# Patient Record
Sex: Female | Born: 1985 | Hispanic: Yes | Marital: Single | Smoking: Never smoker
Health system: Southern US, Community
[De-identification: ages and names within clinical notes are randomized; demographics above are authoritative.]

## PROBLEM LIST (undated history)

## (undated) DIAGNOSIS — E119 Type 2 diabetes mellitus without complications: Secondary | ICD-10-CM

## (undated) DIAGNOSIS — F419 Anxiety disorder, unspecified: Secondary | ICD-10-CM

## (undated) DIAGNOSIS — R109 Unspecified abdominal pain: Secondary | ICD-10-CM

## (undated) DIAGNOSIS — R739 Hyperglycemia, unspecified: Secondary | ICD-10-CM

## (undated) HISTORY — PX: CHOLECYSTECTOMY: SHX55

## (undated) HISTORY — PX: TONSILLECTOMY: SUR1361

## (undated) HISTORY — PX: BREAST SURGERY: SHX581

---

## 2016-10-17 ENCOUNTER — Other Ambulatory Visit: Payer: Self-pay | Admitting: Family Medicine

## 2017-03-09 ENCOUNTER — Other Ambulatory Visit: Payer: Self-pay | Admitting: Family Medicine

## 2017-03-09 DIAGNOSIS — R102 Pelvic and perineal pain: Secondary | ICD-10-CM

## 2017-03-19 ENCOUNTER — Ambulatory Visit
Admission: RE | Admit: 2017-03-19 | Discharge: 2017-03-19 | Disposition: A | Discharge status: Home / Self Care | Payer: BLUE CROSS/BLUE SHIELD | Source: Ambulatory Visit | Attending: Family Medicine | Admitting: Family Medicine

## 2017-03-19 DIAGNOSIS — R102 Pelvic and perineal pain: Secondary | ICD-10-CM

## 2017-09-03 ENCOUNTER — Emergency Department
Admission: EM | Admit: 2017-09-03 | Discharge: 2017-09-03 | Disposition: A | Discharge status: Home / Self Care | Payer: BLUE CROSS/BLUE SHIELD | Attending: Emergency Medicine | Admitting: Emergency Medicine

## 2017-09-03 ENCOUNTER — Encounter: Payer: Self-pay | Admitting: Emergency Medicine

## 2017-09-03 ENCOUNTER — Other Ambulatory Visit: Payer: Self-pay

## 2017-09-03 DIAGNOSIS — N730 Acute parametritis and pelvic cellulitis: Secondary | ICD-10-CM

## 2017-09-03 DIAGNOSIS — R319 Hematuria, unspecified: Secondary | ICD-10-CM | POA: Insufficient documentation

## 2017-09-03 DIAGNOSIS — R1013 Epigastric pain: Secondary | ICD-10-CM | POA: Insufficient documentation

## 2017-09-03 DIAGNOSIS — N390 Urinary tract infection, site not specified: Secondary | ICD-10-CM | POA: Insufficient documentation

## 2017-09-03 LAB — WET PREP, GENITAL
Sperm: NONE SEEN
Trich, Wet Prep: NONE SEEN
YEAST WET PREP: NONE SEEN

## 2017-09-03 LAB — CBC
HEMATOCRIT: 38.9 % (ref 35.0–47.0)
HEMOGLOBIN: 12.9 g/dL (ref 12.0–16.0)
MCH: 27.4 pg (ref 26.0–34.0)
MCHC: 33.3 g/dL (ref 32.0–36.0)
MCV: 82.2 fL (ref 80.0–100.0)
Platelets: 336 10*3/uL (ref 150–440)
RBC: 4.73 MIL/uL (ref 3.80–5.20)
RDW: 14.5 % (ref 11.5–14.5)
WBC: 8.2 10*3/uL (ref 3.6–11.0)

## 2017-09-03 LAB — URINALYSIS, COMPLETE (UACMP) WITH MICROSCOPIC
BACTERIA UA: NONE SEEN
BILIRUBIN URINE: NEGATIVE
GLUCOSE, UA: NEGATIVE mg/dL
KETONES UR: NEGATIVE mg/dL
NITRITE: NEGATIVE
PROTEIN: NEGATIVE mg/dL
Specific Gravity, Urine: 1.023 (ref 1.005–1.030)
pH: 5 (ref 5.0–8.0)

## 2017-09-03 LAB — COMPREHENSIVE METABOLIC PANEL
ALBUMIN: 4.4 g/dL (ref 3.5–5.0)
ALT: 13 U/L — ABNORMAL LOW (ref 14–54)
ANION GAP: 8 (ref 5–15)
AST: 20 U/L (ref 15–41)
Alkaline Phosphatase: 65 U/L (ref 38–126)
BUN: 9 mg/dL (ref 6–20)
CHLORIDE: 106 mmol/L (ref 101–111)
CO2: 21 mmol/L — ABNORMAL LOW (ref 22–32)
Calcium: 8.8 mg/dL — ABNORMAL LOW (ref 8.9–10.3)
Creatinine, Ser: 0.53 mg/dL (ref 0.44–1.00)
GFR calc Af Amer: >60 mL/min (ref >60)
GLUCOSE: 99 mg/dL (ref 65–99)
POTASSIUM: 4.2 mmol/L (ref 3.5–5.1)
Sodium: 135 mmol/L (ref 135–145)
Total Bilirubin: 0.5 mg/dL (ref 0.3–1.2)
Total Protein: 7.7 g/dL (ref 6.5–8.1)

## 2017-09-03 LAB — CHLAMYDIA/NGC RT PCR (ARMC ONLY)
CHLAMYDIA TR: NOT DETECTED
N GONORRHOEAE: NOT DETECTED

## 2017-09-03 LAB — POCT PREGNANCY, URINE: Preg Test, Ur: NEGATIVE

## 2017-09-03 LAB — LIPASE, BLOOD: LIPASE: 33 U/L (ref 11–51)

## 2017-09-03 MED ORDER — DOXYCYCLINE HYCLATE 100 MG PO CAPS: 100.0000 mg | ORAL_CAPSULE | Freq: Two times a day (BID) | ORAL | 0 refills | Status: DC

## 2017-09-03 MED ORDER — CEFTRIAXONE SODIUM 1 G IJ SOLR
500.0000 mg | Freq: Once | INTRAMUSCULAR | Status: AC
  Administered 2017-09-03: 500 mg via INTRAMUSCULAR
  Filled 2017-09-03: qty 10

## 2017-09-03 MED ORDER — METRONIDAZOLE 500 MG PO TABS: 500.0000 mg | ORAL_TABLET | Freq: Three times a day (TID) | ORAL | 0 refills | Status: AC

## 2017-09-03 NOTE — ED Provider Notes (Signed)
[facility] Emergency Department Provider Note   ____________________________________________   First MD Initiated Contact with Patient 09/03/17 [phone]     (approximate)  I have reviewed the triage vital signs and the nursing notes.   HISTORY  Chief Complaint Abdominal Pain    HPI Carmen RiserStephanie Francis is a 32 y.o. female Who complains  abdominal pain and dysuria urgency frequency for about 3 days. The abdominal pain is diffuse across lower abdomen she has a little bit of epigastric pain as well. She had a fever at home yesterday. Pain is deep and achy moderately severe and been going on for couple days and getting worse.   History reviewed. No pertinent past medical history.  There are no active problems to display for this patient.   History reviewed. No pertinent surgical history.  Prior to Admission medications   Medication Sig Start Date End Date Taking? Authorizing Provider  doxycycline (VIBRAMYCIN) 100 MG capsule Take 1 capsule (100 mg total) by mouth 2 (two) times daily. 09/03/17   Arnaldo NatalMalinda, Arta Stump F, MD  metroNIDAZOLE (FLAGYL) 500 MG tablet Take 1 tablet (500 mg total) by mouth 3 (three) times daily for 7 days. 09/03/17 09/10/17  Arnaldo NatalMalinda, Feras Gardella F, MD    Allergies Patient has no known allergies.  No family history on file.  Social History Social History   Tobacco Use  . Smoking status: Never Smoker  Substance Use Topics  . Alcohol use: Not on file  . Drug use: Not on file    Review of Systems  Constitutional:  fever/chills Eyes: No visual changes. ENT: No sore throat. Cardiovascular: Denies chest pain. Respiratory: Denies shortness of breath. Gastrointestinal:  abdominal pain.  No nausea, no vomiting.  No diarrhea.  No constipation. Genitourinary:  dysuria. Musculoskeletal: Negative for back pain. Skin: Negative for rash.   ____________________________________________   PHYSICAL EXAM:  VITAL SIGNS: ED Triage Vitals  Enc Vitals  Group     BP 09/03/17 0729 117/78     Pulse Rate 09/03/17 0729 97     Resp 09/03/17 0729 20     Temp 09/03/17 0729 98 F (36.7 C)     Temp Source 09/03/17 0729 Oral     SpO2 09/03/17 0729 99 %     Weight 09/03/17 0730 190 lb (86.2 kg)     Height 09/03/17 0730 5\' 2"  (1.575 m)     Head Circumference --      Peak Flow --      Pain Score 09/03/17 0729 8     Pain Loc --      Pain Edu? --      Excl. in GC? --    Constitutional: Alert and oriented. Well appearing and in no acute distress. Eyes: Conjunctivae are normal. PER. EOMI. Head: Atraumatic. Nose: No congestion/rhinnorhea. Mouth/Throat: Mucous membranes are moist.  Oropharynx non-erythematous. Neck: No stridor.  Cardiovascular: Normal rate, regular rhythm. Grossly normal heart sounds.  Good peripheral circulation. Respiratory: Normal respiratory effort.  No retractions. Lungs CTAB. Gastrointestinal: Soft and nontender. No distention. No abdominal bruits. No CVA tenderness. Genitourinary:normal perineum scant whitish discharge in the vaginashe has cervical motion tenderness and bilateral adnexal tenderness Musculoskeletal: No lower extremity tenderness nor edema.  No joint effusions. Neurologic:  Normal speech and language. No gross focal neurologic deficits are appreciated. No gait instability. Skin:  Skin is warm, dry and intact. No rash noted. Psychiatric: Mood and affect are normal. Speech and behavior are normal.  ____________________________________________   LABS (all labs ordered are  listed, but only abnormal results are displayed)  Labs Reviewed  WET PREP, GENITAL - Abnormal; Notable for the following components:      Result Value   Clue Cells Wet Prep HPF POC PRESENT (*)    WBC, Wet Prep HPF POC RARE (*)    All other components within normal limits  COMPREHENSIVE METABOLIC PANEL - Abnormal; Notable for the following components:   CO2 21 (*)    Calcium 8.8 (*)    ALT 13 (*)    All other components within normal  limits  URINALYSIS, COMPLETE (UACMP) WITH MICROSCOPIC - Abnormal; Notable for the following components:   Color, Urine YELLOW (*)    APPearance CLOUDY (*)    Hgb urine dipstick SMALL (*)    Leukocytes, UA SMALL (*)    All other components within normal limits  CHLAMYDIA/NGC RT PCR ([facility] ONLY)  LIPASE, BLOOD  CBC  POC URINE PREG, ED  POCT PREGNANCY, URINE   ____________________________________________  EKG   ____________________________________________  RADIOLOGY  ED MD interpretation:    Official radiology report(s): No results found.  ____________________________________________   PROCEDURES  Procedure(s) performed:   Procedures  Critical Care performed:  ____________________________________________   INITIAL IMPRESSION / ASSESSMENT AND PLAN / ED COURSE patient with no fever now she does have cervical motion tenderness. We'll treat her with doxycycline and Rocephin and Flagyl. Hopefully this will take care of the problem. She also has what looks like a UTI with some hematuria. The Rocephin and other antibiotics should take care of this. I will have her return if she is worse.          ____________________________________________   FINAL CLINICAL IMPRESSION(S) / ED DIAGNOSES  Final diagnoses:  Urinary tract infection with hematuria, site unspecified  PID (acute pelvic inflammatory disease)     ED Discharge Orders        Ordered    doxycycline (VIBRAMYCIN) 100 MG capsule  2 times daily     09/03/17 1122    metroNIDAZOLE (FLAGYL) 500 MG tablet  3 times daily     09/03/17 1122       Note:  This document was prepared using Dragon voice recognition software and may include unintentional dictation errors.    Arnaldo Natal, MD 09/03/17 1125

## 2017-09-03 NOTE — ED Triage Notes (Addendum)
Lower mid abdominal pain and pain with urination x 3 days. During triage initiation speaking English and states does not need interpreter at this time. Requests interpreter during provider exam.

## 2017-09-03 NOTE — ED Notes (Signed)
First Nurse Note:  Patient to Rm 26.  Stephaine RN aware of placement.

## 2017-09-03 NOTE — ED Notes (Signed)
Pt c/o lower abd pain with N/V and painful urination with clear discharge since Friday.

## 2017-09-03 NOTE — Discharge Instructions (Signed)
Take the Flagyl antibiotic one pill 3 times a day for a week and the doxycycline antibiotic one pill 2 times a day until it is finished. Be careful the doxycycline can make you get sunburns more easily. Make sure you drink a full glass of water each time you take the doxycycline to make sure it doesn't get stuck in your esophagus as it can cause burns if that happens. Return for worse pain, return of high fever, vomiting or if you're not getting any better in 3 or 4 days or see your doctor.

## 2017-09-03 NOTE — ED Notes (Signed)
First Nurse Note:  Patient states she has been ill X 3 days.  Emesis and abdominal pain.  Alert and oriented.

## 2017-09-03 NOTE — ED Notes (Signed)
Pt aware that lab needed another urina specimen. Pt unable to urinate at this time d/t just urinating not very long ago. Pt given specimen cup for when is able to void.

## 2017-09-03 NOTE — ED Notes (Signed)
First Nurse Note:  Lance BoschShea from lab called approx. 10 min ago to request another urine.  Daun PeacockMelanie Tech made aware.

## 2017-09-06 ENCOUNTER — Other Ambulatory Visit: Payer: Self-pay

## 2017-09-06 ENCOUNTER — Emergency Department
Admission: EM | Admit: 2017-09-06 | Discharge: 2017-09-06 | Disposition: A | Discharge status: Home / Self Care | Payer: Self-pay | Attending: Emergency Medicine | Admitting: Emergency Medicine

## 2017-09-06 ENCOUNTER — Emergency Department: Payer: Self-pay

## 2017-09-06 ENCOUNTER — Encounter: Payer: Self-pay | Admitting: Emergency Medicine

## 2017-09-06 DIAGNOSIS — N390 Urinary tract infection, site not specified: Secondary | ICD-10-CM | POA: Insufficient documentation

## 2017-09-06 DIAGNOSIS — B9689 Other specified bacterial agents as the cause of diseases classified elsewhere: Secondary | ICD-10-CM | POA: Insufficient documentation

## 2017-09-06 DIAGNOSIS — N760 Acute vaginitis: Secondary | ICD-10-CM | POA: Insufficient documentation

## 2017-09-06 DIAGNOSIS — R109 Unspecified abdominal pain: Secondary | ICD-10-CM | POA: Insufficient documentation

## 2017-09-06 LAB — URINALYSIS, COMPLETE (UACMP) WITH MICROSCOPIC
Bilirubin Urine: NEGATIVE
Glucose, UA: NEGATIVE mg/dL
Hgb urine dipstick: NEGATIVE
Ketones, ur: 5 mg/dL — AB
Nitrite: NEGATIVE
PH: 5 (ref 5.0–8.0)
Protein, ur: 30 mg/dL — AB
SPECIFIC GRAVITY, URINE: 1.033 — AB (ref 1.005–1.030)

## 2017-09-06 LAB — POCT PREGNANCY, URINE: PREG TEST UR: NEGATIVE

## 2017-09-06 MED ORDER — OXYCODONE-ACETAMINOPHEN 5-325 MG PO TABS
1.0000 | ORAL_TABLET | Freq: Once | ORAL | Status: AC
  Administered 2017-09-06: 1 via ORAL
  Filled 2017-09-06: qty 1

## 2017-09-06 MED ORDER — OXYCODONE-ACETAMINOPHEN 5-325 MG PO TABS: 1.0000 | ORAL_TABLET | Freq: Four times a day (QID) | ORAL | 0 refills | Status: DC | PRN

## 2017-09-06 NOTE — ED Triage Notes (Signed)
States she was seen on Monday for UTI  States pain is to bilateral flank area  Worse on right

## 2017-09-06 NOTE — Discharge Instructions (Addendum)
Follow-up with your primary care provider if any continued problems.  Continue taking antibiotics as directed.  Percocet 1 every 6 hours as needed for severe pain.  Increase fluids.  You may also take ibuprofen if needed for back pain.

## 2017-09-06 NOTE — ED Provider Notes (Signed)
[facility] Emergency Department Provider Note  ____________________________________________   First MD Initiated Contact with Patient 09/06/17 1249     (approximate)  I have reviewed the triage vital signs and the nursing notes.   HISTORY  Chief Complaint Flank Pain   HPI Carmen RiserStephanie Sherlin is a 32 y.o. female complaint of right flank pain.  Patient states that she was seen 3 days ago in the ED at which time she was diagnosed with what she thought was a UTI.  In looking over the patient's chart she was also treated for PID with a shot of Rocephin and given prescriptions for doxycycline and Flagyl.  Patient states that she is currently taking both antibiotics.  She denies any nausea or vomiting.  There is been no fever or chills.  Patient also this morning developed diarrhea.  She denies any worsening of dysuria or frequency.  She is unaware of any past history of kidney stones or family history.   History reviewed. No pertinent past medical history.  There are no active problems to display for this patient.   History reviewed. No pertinent surgical history.  Prior to Admission medications   Medication Sig Start Date End Date Taking? Authorizing Provider  doxycycline (VIBRAMYCIN) 100 MG capsule Take 1 capsule (100 mg total) by mouth 2 (two) times daily. 09/03/17   Arnaldo NatalMalinda, Paul F, MD  metroNIDAZOLE (FLAGYL) 500 MG tablet Take 1 tablet (500 mg total) by mouth 3 (three) times daily for 7 days. 09/03/17 09/10/17  Arnaldo NatalMalinda, Paul F, MD  oxyCODONE-acetaminophen (PERCOCET) 5-325 MG tablet Take 1 tablet by mouth every 6 (six) hours as needed for severe pain. 09/06/17   Tommi RumpsSummers, Tierney Behl L, PA-C    Allergies Patient has no known allergies.  No family history on file.  Social History Social History   Tobacco Use  . Smoking status: Never Smoker  . Smokeless tobacco: Never Used  Substance Use Topics  . Alcohol use: Not on file  . Drug use: Not on file    Review of  Systems Constitutional: No fever/chills Cardiovascular: Denies chest pain. Respiratory: Denies shortness of breath. Gastrointestinal: No abdominal pain.  No nausea, no vomiting.  Positive diarrhea.  No constipation. Genitourinary: Negative for dysuria.  Positive right flank pain. Musculoskeletal: Negative for back pain. Skin: Negative for rash. Neurological: Negative for headaches, focal weakness or numbness. ____________________________________________   PHYSICAL EXAM:  VITAL SIGNS: ED Triage Vitals [09/06/17 1249]  Enc Vitals Group     BP      Pulse      Resp      Temp      Temp src      SpO2      Weight 190 lb (86.2 kg)     Height 5\' 2"  (1.575 m)     Head Circumference      Peak Flow      Pain Score 5     Pain Loc      Pain Edu?      Excl. in GC?    Constitutional: Alert and oriented. Well appearing and in no acute distress. Eyes: Conjunctivae are normal.  Head: Atraumatic. Neck: No stridor.   Cardiovascular: Normal rate, regular rhythm. Grossly normal heart sounds.  Good peripheral circulation. Respiratory: Normal respiratory effort.  No retractions. Lungs CTAB. Gastrointestinal: Soft and nontender. No distention. .  Right CVA tenderness.  Bowel sounds normoactive x4 quadrants. Musculoskeletal: Moves upper and lower extremities without any difficulty.  Normal gait was noted. Neurologic:  Normal speech and language. No gross focal neurologic deficits are appreciated. Skin:  Skin is warm, dry and intact.  Psychiatric: Mood and affect are normal. Speech and behavior are normal.  ____________________________________________   LABS (all labs ordered are listed, but only abnormal results are displayed)  Labs Reviewed  URINALYSIS, COMPLETE (UACMP) WITH MICROSCOPIC - Abnormal; Notable for the following components:      Result Value   Color, Urine AMBER (*)    APPearance HAZY (*)    Specific Gravity, Urine 1.033 (*)    Ketones, ur 5 (*)    Protein, ur 30 (*)     Leukocytes, UA SMALL (*)    Bacteria, UA RARE (*)    All other components within normal limits  URINE CULTURE  POCT PREGNANCY, URINE  POC URINE PREG, ED    RADIOLOGY   Official radiology report(s): Ct Renal Stone Study  Result Date: 09/06/2017 CLINICAL DATA:  BILATERAL flank pain worse on RIGHT, stone disease suspected EXAM: CT ABDOMEN AND PELVIS WITHOUT CONTRAST TECHNIQUE: Multidetector CT imaging of the abdomen and pelvis was performed following the standard protocol without IV contrast. Sagittal and coronal MPR images reconstructed from axial data set. Oral contrast was not administered. COMPARISON:  None FINDINGS: Lower chest: Lung bases clear Hepatobiliary: Post cholecystectomy.  Liver unremarkable. Pancreas: Normal appearance Spleen: Normal appearance Adrenals/Urinary Tract: Adrenal glands normal appearance. Kidneys, ureters, and bladder normal appearance. No urinary tract calcification or dilatation. Stomach/Bowel: Appendix not visualized but no pericecal inflammatory process is seen. Stomach and bowel loops normal appearance Vascular/Lymphatic: Vascular structures unremarkable. Normal size mesenteric lymph nodes. Retroaortic LEFT renal vein. No abdominal or pelvic adenopathy. Reproductive: Dominant follicle LEFT ovary 2.5 x 2.5 cm. Otherwise unremarkable uterus and adnexa Other: No free air or free fluid. No hernia or acute inflammatory process. Musculoskeletal: Bone island LEFT sacrum. No acute osseous findings. IMPRESSION: No acute intra-abdominal or intrapelvic abnormalities. Electronically Signed   By: Ulyses Southward M.D.   On: 09/06/2017 14:27    ____________________________________________   PROCEDURES  Procedure(s) performed: None  Procedures  Critical Care performed: No  ____________________________________________   INITIAL IMPRESSION / ASSESSMENT AND PLAN / ED COURSE  As part of my medical decision making, I reviewed the following data within the electronic medical  record:  Notes from prior ED visits and [place] Controlled Substance Database  Using the interpreter is explained to her that she did have a urinary tract infection along with bacterial vaginosis.  She also admits that she did receive a injection which was Rocephin 3 days ago.  Patient denies any vaginal discharge at this time.  She denies any pelvic pain.  CT renal study is negative for stone.  At this time patient is comfortable after having taken Percocet while in the ED.  Patient was given a prescription to continue every 6 hours as needed for pain.  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  She will finish the antibiotics as prescribed on Monday and follow-up with her primary care provider if any continued problems or return to the emergency department if any severe worsening of her symptoms.  ____________________________________________   FINAL CLINICAL IMPRESSION(S) / ED DIAGNOSES  Final diagnoses:  Right flank pain  Acute urinary tract infection  BV (bacterial vaginosis)     ED Discharge Orders        Ordered    oxyCODONE-acetaminophen (PERCOCET) 5-325 MG tablet  Every 6 hours PRN  09/06/17 1526       Note:  This document was prepared using Dragon voice recognition software and may include unintentional dictation errors.    Tommi Rumps, PA-C 09/06/17 1644    Emily Filbert, MD 09/15/17 [phone]

## 2017-09-08 LAB — URINE CULTURE
Culture: <10000 — AB
SPECIAL REQUESTS: NORMAL

## 2018-06-03 ENCOUNTER — Emergency Department
Admission: EM | Admit: 2018-06-03 | Discharge: 2018-06-03 | Disposition: A | Discharge status: Home / Self Care | Payer: Self-pay | Attending: Emergency Medicine | Admitting: Emergency Medicine

## 2018-06-03 ENCOUNTER — Other Ambulatory Visit: Payer: Self-pay

## 2018-06-03 ENCOUNTER — Emergency Department: Payer: Self-pay

## 2018-06-03 ENCOUNTER — Encounter: Payer: Self-pay | Admitting: Emergency Medicine

## 2018-06-03 DIAGNOSIS — R1031 Right lower quadrant pain: Secondary | ICD-10-CM | POA: Insufficient documentation

## 2018-06-03 DIAGNOSIS — R109 Unspecified abdominal pain: Secondary | ICD-10-CM

## 2018-06-03 HISTORY — DX: Hyperglycemia, unspecified: R73.9

## 2018-06-03 LAB — CBC WITH DIFFERENTIAL/PLATELET
ABS IMMATURE GRANULOCYTES: 0.03 10*3/uL (ref 0.00–0.07)
Basophils Absolute: 0.1 10*3/uL (ref 0.0–0.1)
Basophils Relative: 1 %
EOS PCT: 2 %
Eosinophils Absolute: 0.2 10*3/uL (ref 0.0–0.5)
HCT: 36.8 % (ref 36.0–46.0)
HEMOGLOBIN: 11.2 g/dL — AB (ref 12.0–15.0)
Immature Granulocytes: 0 %
Lymphocytes Relative: 17 %
Lymphs Abs: 1.7 10*3/uL (ref 0.7–4.0)
MCH: 26.2 pg (ref 26.0–34.0)
MCHC: 30.4 g/dL (ref 30.0–36.0)
MCV: 86 fL (ref 80.0–100.0)
MONO ABS: 0.6 10*3/uL (ref 0.1–1.0)
MONOS PCT: 6 %
NEUTROS ABS: 7.4 10*3/uL (ref 1.7–7.7)
Neutrophils Relative %: 74 %
Platelets: 358 10*3/uL (ref 150–400)
RBC: 4.28 MIL/uL (ref 3.87–5.11)
RDW: 14.1 % (ref 11.5–15.5)
WBC: 10 10*3/uL (ref 4.0–10.5)
nRBC: 0 % (ref 0.0–0.2)

## 2018-06-03 LAB — COMPREHENSIVE METABOLIC PANEL
ALK PHOS: 58 U/L (ref 38–126)
ALT: 12 U/L (ref 0–44)
AST: 24 U/L (ref 15–41)
Albumin: 4.3 g/dL (ref 3.5–5.0)
Anion gap: 9 (ref 5–15)
BUN: 9 mg/dL (ref 6–20)
CHLORIDE: 104 mmol/L (ref 98–111)
CO2: 24 mmol/L (ref 22–32)
CREATININE: 0.54 mg/dL (ref 0.44–1.00)
Calcium: 8.8 mg/dL — ABNORMAL LOW (ref 8.9–10.3)
Glucose, Bld: 79 mg/dL (ref 70–99)
POTASSIUM: 3.9 mmol/L (ref 3.5–5.1)
Sodium: 137 mmol/L (ref 135–145)
Total Bilirubin: 0.5 mg/dL (ref 0.3–1.2)
Total Protein: 7.7 g/dL (ref 6.5–8.1)

## 2018-06-03 LAB — URINALYSIS, COMPLETE (UACMP) WITH MICROSCOPIC
Bacteria, UA: NONE SEEN
Bilirubin Urine: NEGATIVE
GLUCOSE, UA: NEGATIVE mg/dL
Hgb urine dipstick: NEGATIVE
KETONES UR: NEGATIVE mg/dL
Leukocytes,Ua: NEGATIVE
Nitrite: NEGATIVE
Protein, ur: NEGATIVE mg/dL
SPECIFIC GRAVITY, URINE: 1.018 (ref 1.005–1.030)
pH: 6 (ref 5.0–8.0)

## 2018-06-03 LAB — POCT PREGNANCY, URINE: Preg Test, Ur: NEGATIVE

## 2018-06-03 LAB — LIPASE, BLOOD: LIPASE: 33 U/L (ref 11–51)

## 2018-06-03 MED ORDER — NAPROXEN 500 MG PO TABS: 500.0000 mg | ORAL_TABLET | Freq: Two times a day (BID) | ORAL | 2 refills | Status: DC

## 2018-06-03 NOTE — ED Triage Notes (Signed)
Right flank pain and now with fever started one week ago.  Vomiting as well.  Swelling to feet and pain.

## 2018-06-03 NOTE — ED Notes (Signed)
Went to ct scan few minutes ago

## 2018-06-03 NOTE — ED Provider Notes (Signed)
[facility] Emergency Department Provider Note   ____________________________________________    I have reviewed the triage vital signs and the nursing notes.   HISTORY  Chief Complaint Flank Pain  Spanish interpreter used   HPI Carmen Francis is a 33 y.o. female who presents with 2 days of right flank pain which is aching in nature and mild to moderate.  Seems to be worse with moving or twisting her torso.  Denies dysuria, no hematuria.  No history of kidney stones.  Is not take anything for this.  No fevers chills nausea or vomiting.  History of cholecystectomy and C-section  Past Medical History:  Diagnosis Date  . Hyperglycemia     There are no active problems to display for this patient.   Past Surgical History:  Procedure Laterality Date  . BREAST SURGERY    . CESAREAN SECTION    . CHOLECYSTECTOMY    . TONSILLECTOMY      Prior to Admission medications   Medication Sig Start Date End Date Taking? Authorizing Provider  doxycycline (VIBRAMYCIN) 100 MG capsule Take 1 capsule (100 mg total) by mouth 2 (two) times daily. 09/03/17   Arnaldo Natal, MD  naproxen (NAPROSYN) 500 MG tablet Take 1 tablet (500 mg total) by mouth 2 (two) times daily with a meal. 06/03/18   Jene Every, MD  oxyCODONE-acetaminophen (PERCOCET) 5-325 MG tablet Take 1 tablet by mouth every 6 (six) hours as needed for severe pain. 09/06/17   Tommi Rumps, PA-C     Allergies Patient has no known allergies.  No family history on file.  Social History Social History   Tobacco Use  . Smoking status: Never Smoker  . Smokeless tobacco: Never Used  Substance Use Topics  . Alcohol use: Not Currently  . Drug use: Not on file    Review of Systems  Constitutional: No fever/chills Eyes: No visual changes.  ENT: No sore throat. Cardiovascular: Denies chest pain. Respiratory: Denies shortness of breath. Gastrointestinal: As above Genitourinary: As  above Musculoskeletal: Negative for back pain. Skin: Negative for rash. Neurological: Negative for headaches or weakness   ____________________________________________   PHYSICAL EXAM:  VITAL SIGNS: ED Triage Vitals  Enc Vitals Group     BP 06/03/18 1112 124/85     Pulse Rate 06/03/18 1112 82     Resp 06/03/18 1112 20     Temp 06/03/18 1112 98.1 F (36.7 C)     Temp Source 06/03/18 1112 Oral     SpO2 06/03/18 1112 100 %     Weight 06/03/18 1115 88.5 kg (195 lb)     Height 06/03/18 1115 1.575 m (5\' 2" )     Head Circumference --      Peak Flow --      Pain Score 06/03/18 1115 8     Pain Loc --      Pain Edu? --      Excl. in GC? --     Constitutional: Alert and oriented.  Eyes: Conjunctivae are normal.   Nose: No congestion/rhinnorhea. Mouth/Throat: Mucous membranes are moist.    Cardiovascular: Normal rate, regular rhythm. Grossly normal heart sounds.  Good peripheral circulation. Respiratory: Normal respiratory effort.  No retractions. Lungs CTAB. Gastrointestinal: Soft and nontender. No distention.  No significant CVA tenderness, reassuring exam Genitourinary: deferred Musculoskeletal:   Warm and well perfused Neurologic:  Normal speech and language. No gross focal neurologic deficits are appreciated.  Skin:  Skin is warm, dry and intact. No  rash noted. Psychiatric: Mood and affect are normal. Speech and behavior are normal.  ____________________________________________   LABS (all labs ordered are listed, but only abnormal results are displayed)  Labs Reviewed  CBC WITH DIFFERENTIAL/PLATELET - Abnormal; Notable for the following components:      Result Value   Hemoglobin 11.2 (*)    All other components within normal limits  COMPREHENSIVE METABOLIC PANEL - Abnormal; Notable for the following components:   Calcium 8.8 (*)    All other components within normal limits  URINALYSIS, COMPLETE (UACMP) WITH MICROSCOPIC - Abnormal; Notable for the following  components:   Color, Urine YELLOW (*)    APPearance CLEAR (*)    All other components within normal limits  LIPASE, BLOOD  POCT PREGNANCY, URINE   ____________________________________________  EKG  None ____________________________________________  RADIOLOGY  CT unremarkable ____________________________________________   PROCEDURES  Procedure(s) performed: No  Procedures   Critical Care performed: No ____________________________________________   INITIAL IMPRESSION / ASSESSMENT AND PLAN / ED COURSE  Pertinent labs & imaging results that were available during my care of the patient were reviewed by me and considered in my medical decision making (see chart for details).  Patient well-appearing and in no acute distress, vital signs quite reassuring.  No CVA tenderness.  Differential includes urinary tract infection, ureterolithiasis, musculoskeletal pain.  Less likely appendicitis.    Lab work is quite reassuring, urinalysis unremarkable.  CT renal stone study is unremarkable.  We will treat with anti-inflammatories have the patient follow-up with her PCP, strict return precautions discussed    ____________________________________________   FINAL CLINICAL IMPRESSION(S) / ED DIAGNOSES  Final diagnoses:  Right flank pain        Note:  This document was prepared using Dragon voice recognition software and may include unintentional dictation errors.   Jene Every, MD 06/03/18 [phone]

## 2018-10-28 ENCOUNTER — Encounter: Payer: Self-pay | Admitting: Emergency Medicine

## 2018-10-28 ENCOUNTER — Emergency Department
Admission: EM | Admit: 2018-10-28 | Discharge: 2018-10-28 | Disposition: A | Discharge status: Home / Self Care | Payer: Medicaid Other | Attending: Emergency Medicine | Admitting: Emergency Medicine

## 2018-10-28 ENCOUNTER — Other Ambulatory Visit: Payer: Self-pay

## 2018-10-28 DIAGNOSIS — N920 Excessive and frequent menstruation with regular cycle: Secondary | ICD-10-CM | POA: Insufficient documentation

## 2018-10-28 DIAGNOSIS — M79606 Pain in leg, unspecified: Secondary | ICD-10-CM | POA: Insufficient documentation

## 2018-10-28 LAB — COMPREHENSIVE METABOLIC PANEL
ALT: 16 U/L (ref 0–44)
AST: 23 U/L (ref 15–41)
Albumin: 4.6 g/dL (ref 3.5–5.0)
Alkaline Phosphatase: 67 U/L (ref 38–126)
Anion gap: 9 (ref 5–15)
BUN: 7 mg/dL (ref 6–20)
CO2: 25 mmol/L (ref 22–32)
Calcium: 9.3 mg/dL (ref 8.9–10.3)
Chloride: 104 mmol/L (ref 98–111)
Creatinine, Ser: 0.54 mg/dL (ref 0.44–1.00)
GFR calc Af Amer: >60 mL/min (ref >60)
GFR calc non Af Amer: >60 mL/min (ref >60)
Glucose, Bld: 96 mg/dL (ref 70–99)
Potassium: 4.2 mmol/L (ref 3.5–5.1)
Sodium: 138 mmol/L (ref 135–145)
Total Bilirubin: 0.5 mg/dL (ref 0.3–1.2)
Total Protein: 7.9 g/dL (ref 6.5–8.1)

## 2018-10-28 LAB — CBC
HCT: 37.7 % (ref 36.0–46.0)
Hemoglobin: 11.8 g/dL — ABNORMAL LOW (ref 12.0–15.0)
MCH: 25.8 pg — ABNORMAL LOW (ref 26.0–34.0)
MCHC: 31.3 g/dL (ref 30.0–36.0)
MCV: 82.3 fL (ref 80.0–100.0)
Platelets: 360 10*3/uL (ref 150–400)
RBC: 4.58 MIL/uL (ref 3.87–5.11)
RDW: 14.3 % (ref 11.5–15.5)
WBC: 8.9 10*3/uL (ref 4.0–10.5)
nRBC: 0 % (ref 0.0–0.2)

## 2018-10-28 LAB — URINALYSIS, COMPLETE (UACMP) WITH MICROSCOPIC
Bacteria, UA: NONE SEEN
Bilirubin Urine: NEGATIVE
Glucose, UA: NEGATIVE mg/dL
Ketones, ur: 5 mg/dL — AB
Leukocytes,Ua: NEGATIVE
Nitrite: NEGATIVE
Protein, ur: NEGATIVE mg/dL
RBC / HPF: >50 RBC/hpf — ABNORMAL HIGH (ref 0–5)
Specific Gravity, Urine: 1.016 (ref 1.005–1.030)
pH: 6 (ref 5.0–8.0)

## 2018-10-28 LAB — LIPASE, BLOOD: Lipase: 27 U/L (ref 11–51)

## 2018-10-28 LAB — POC URINE PREG, ED: Preg Test, Ur: NEGATIVE

## 2018-10-28 MED ORDER — SODIUM CHLORIDE 0.9% FLUSH: 3.0000 mL | Freq: Once | INTRAVENOUS | Status: DC

## 2018-10-28 MED ORDER — IBUPROFEN 800 MG PO TABS: 800.0000 mg | ORAL_TABLET | Freq: Three times a day (TID) | ORAL | 0 refills | Status: DC | PRN

## 2018-10-28 MED ORDER — ONDANSETRON 4 MG PO TBDP: 4.0000 mg | ORAL_TABLET | Freq: Three times a day (TID) | ORAL | 0 refills | Status: DC | PRN

## 2018-10-28 NOTE — ED Triage Notes (Signed)
Pt here with c/o right sided abd pain during her periods, states they are heavy, also bilateral leg pain. NAD.

## 2018-10-28 NOTE — ED Notes (Signed)
See triage note  Presents with right sided abd pain   describes as cramping   States pain started when her period started

## 2018-10-28 NOTE — Discharge Instructions (Addendum)
Your exam and labs are normal at this time. Your symptoms are consistent with premenstrual symptoms like nausea, vomiting, and breast tenderness. You also have heavy regular bleeding and pelvic pain with your periods. You should be evaluated by Gynecology for further management. Take the prescription meds as once daily for pain with at the start of menses and continue until your period is over. Return to the ED as needed.   Su examen y sus laboratorios son normales en este momento. Sus sntomas son consistentes con sntomas premenstruales como nuseas, vmitos y sensibilidad en los senos. Tambin tiene sangrado abundante y dolor plvico con sus perodos. Debe ser evaluado por Ginecologa para su posterior manejo. Tome los medicamentos recetados como una vez al da para el dolor al comienzo de la menstruacin y contine hasta que termine su perodo. Regrese al ED segn sea necesario.

## 2018-10-28 NOTE — ED Provider Notes (Signed)
[facility] Emergency Department Provider Note ____________________________________________  Time seen: 1656  I have reviewed the triage vital signs and the nursing notes.  HISTORY  Chief Complaint  Abdominal Pain and Leg Pain  History limited by Spanish language.  Interpreter present for interview and exam.  HPI Carmen Francis is a 33 y.o. female (G2P2) presents to the ED for evaluation of intermittent right-sided abdominal pain.  Patient describes the pain as achy and cramping, and describes the pain occurs only during her menstrual periods,and has been persistent for the last year.  Patient reports that her menses are quite heavy, and associated with premenstrual symptoms of headache, nausea, vomiting, and breast pain. She has been evaluated by GYN in the past, but is not currently under the care of her current OB provider.  She recalls that she has been told that previous ultrasounds are normal, and offered birth control for her symptoms. She declined hormone therapy for fear of weight-gain. Patient denies any fevers, chills, sweats patient also denies any syncope, fatigue, or unusual cravings.  Past Medical History:  Diagnosis Date  . Hyperglycemia     There are no active problems to display for this patient.   Past Surgical History:  Procedure Laterality Date  . BREAST SURGERY    . CESAREAN SECTION    . CHOLECYSTECTOMY    . TONSILLECTOMY      Prior to Admission medications   Medication Sig Start Date End Date Taking? Authorizing Provider  ibuprofen (ADVIL) 800 MG tablet Take 1 tablet (800 mg total) by mouth every 8 (eight) hours as needed. 10/28/18   Saurabh Hettich, Charlesetta IvoryJenise V Bacon, PA-C  ondansetron (ZOFRAN ODT) 4 MG disintegrating tablet Take 1 tablet (4 mg total) by mouth every 8 (eight) hours as needed. 10/28/18   Katriana Dortch, Charlesetta IvoryJenise V Bacon, PA-C    Allergies Patient has no known allergies.  No family history on file.  Social History Social History    Tobacco Use  . Smoking status: Never Smoker  . Smokeless tobacco: Never Used  Substance Use Topics  . Alcohol use: Not Currently  . Drug use: Not on file    Review of Systems  Constitutional: Negative for fever. Eyes: Negative for visual changes. ENT: Negative for sore throat. Cardiovascular: Negative for chest pain. Respiratory: Negative for shortness of breath. Gastrointestinal: Negative for vomiting and diarrhea. Reports right-sided abdominal pain Genitourinary: Negative for dysuria or vaginal discharge. Reports heavy menses Musculoskeletal: Negative for back pain. Skin: Negative for rash. Neurological: Negative for headaches, focal weakness or numbness. ____________________________________________  PHYSICAL EXAM:  VITAL SIGNS: ED Triage Vitals  Enc Vitals Group     BP 10/28/18 1452 118/66     Pulse Rate 10/28/18 1452 80     Resp 10/28/18 1452 16     Temp 10/28/18 1452 98.9 F (37.2 C)     Temp Source 10/28/18 1452 Oral     SpO2 10/28/18 1452 100 %     Weight 10/28/18 1453 190 lb (86.2 kg)     Height 10/28/18 1453 5\' 2"  (1.575 m)     Head Circumference --      Peak Flow --      Pain Score 10/28/18 1453 7     Pain Loc --      Pain Edu? --      Excl. in GC? --     Constitutional: Alert and oriented. Well appearing and in no distress. Head: Normocephalic and atraumatic. Eyes: Conjunctivae are normal. Normal extraocular movements Cardiovascular:  Normal rate, regular rhythm. Normal distal pulses. Respiratory: Normal respiratory effort. No wheezes/rales/rhonchi. Gastrointestinal: Soft and nontender, except with moderate palpation over the right pelvic region. No distention, rebound, guarding, or rigidity. No CVA tenderness noted.  GYN: defered Musculoskeletal: Nontender with normal range of motion in all extremities.  Neurologic:  Normal gait without ataxia. Normal speech and language. No gross focal neurologic deficits are appreciated. Skin:  Skin is warm, dry  and intact. No rash noted. Psychiatric: Mood and affect are normal. Patient exhibits appropriate insight and judgment. ____________________________________________   LABS (pertinent positives/negatives)  Labs Reviewed  CBC - Abnormal; Notable for the following components:      Result Value   Hemoglobin 11.8 (*)    MCH 25.8 (*)    All other components within normal limits  URINALYSIS, COMPLETE (UACMP) WITH MICROSCOPIC - Abnormal; Notable for the following components:   Color, Urine YELLOW (*)    APPearance HAZY (*)    Hgb urine dipstick LARGE (*)    Ketones, ur 5 (*)    RBC / HPF >50 (*)    All other components within normal limits  LIPASE, BLOOD  COMPREHENSIVE METABOLIC PANEL  POC URINE PREG, ED  ____________________________________________  PROCEDURES  Procedures ____________________________________________  INITIAL IMPRESSION / ASSESSMENT AND PLAN / ED COURSE  [name] was evaluated in Emergency Department on 10/28/2018 for the symptoms described in the history of present illness. She was evaluated in the context of the global COVID-19 pandemic, which necessitated consideration that the patient might be at risk for infection with the SARS-CoV-2 virus that causes COVID-19. Institutional protocols and algorithms that pertain to the evaluation of patients at risk for COVID-19 are in a state of rapid change based on information released by regulatory bodies including the CDC and federal and state organizations. These policies and algorithms were followed during the patient's care in the ED.  Patient with ED evaluation of heavy, regular monthly periods with pelvic pain. Her exam, labs, and recent CT scans were normal. Her symptoms are consistent with menorrhagia and PMS. She may have underlying endometriosis or  adhesions. She is referred to GYN for further evaluation and management. A prescription for IBU 800 mg and Zofran. Return precautions are reviewed.   ____________________________________________  FINAL CLINICAL IMPRESSION(S) / ED DIAGNOSES  Final diagnoses:  Menorrhagia with regular cycle      Carmen Needles, PA-C 10/28/18 1841    Nance Pear, MD 10/28/18 [phone]

## 2018-11-13 ENCOUNTER — Encounter
Admission: RE | Admit: 2018-11-13 | Discharge: 2018-11-13 | Disposition: A | Discharge status: Home / Self Care | Payer: HRSA Program | Source: Ambulatory Visit | Attending: Obstetrics and Gynecology | Admitting: Obstetrics and Gynecology

## 2018-11-13 ENCOUNTER — Other Ambulatory Visit: Payer: Self-pay

## 2018-11-13 DIAGNOSIS — Z20828 Contact with and (suspected) exposure to other viral communicable diseases: Secondary | ICD-10-CM | POA: Diagnosis not present

## 2018-11-13 DIAGNOSIS — Z01812 Encounter for preprocedural laboratory examination: Secondary | ICD-10-CM | POA: Insufficient documentation

## 2018-11-13 DIAGNOSIS — N944 Primary dysmenorrhea: Secondary | ICD-10-CM | POA: Insufficient documentation

## 2018-11-13 HISTORY — DX: Unspecified abdominal pain: R10.9

## 2018-11-13 HISTORY — DX: Anxiety disorder, unspecified: F41.9

## 2018-11-13 LAB — SARS CORONAVIRUS 2 (TAT 6-24 HRS): SARS Coronavirus 2: NEGATIVE

## 2018-11-13 NOTE — Pre-Procedure Instructions (Signed)
Requested orders. 

## 2018-11-13 NOTE — H&P (Signed)
Ms. Mariah MillingMorales is a 33 y.o. female here for ER follow up (rlq pain, menorrhagia)  History of Present Illness: Patient went to the ER on 10/28/2018 with intermittent right-sided abdominal pain. Her menses is heavy and associated with headaches, nausea, vomiting, and breast pain. She has seen GYN in the past and she reports previous normal ultrasounds. She has been offered birth control for her symptoms, she declined hormone therapy for fear of weight gain.  Today: Today, patient reports she has been having severe pelvic pain during her period for about 2 years. Her pain begins at the start of her period and lasts her whole period, that lasts for 6-8 days. Pain has worsened over the years. She cannot get the pain to subside; she has tried Midol at home with no relief. She states her pain is so severe that she goes to sleep crying. At the ER, she was given Ibuprofen 800mg  and Zofran with no relief.  -Menarche was 33yo -Menses: sometimes monthly, sometimes twice monthly  Pertinent Hx: -C/S x2 -Patient thinks she had a BTL at age 33 with her second c/s -Hx of cholecystectomy  Past Medical History:  has a past medical history of Dysmenorrhea and Hyperglycemia.  Past Surgical History:  has a past surgical history that includes Breast surgery; Cesarean section; Cholecystectomy; Tonsillectomy; and Tubal ligation. Family History: family history is not on file. Social History:  reports that she has never smoked. She has never used smokeless tobacco. She reports that she does not use drugs. OB/GYN History:          OB History    Gravida  2   Para  2   Term  2   Preterm      AB      Living  2     SAB      TAB      Ectopic      Molar      Multiple      Live Births           Allergies: has no allergies on file. Medications: Current Outpatient Medications:  .  ibuprofen (MOTRIN) 800 MG tablet, Take 800 mg by mouth every 8 (eight) hours as needed, Disp: , Rfl:  .   citalopram (CELEXA) 20 MG tablet, Take 1 tablet (20 mg total) by mouth once daily, Disp: 30 tablet, Rfl: 11 .  clonazePAM (KLONOPIN) 0.25 MG disintegrating tablet, Take 1 tablet (0.25 mg total) by mouth 2 (two) times daily as needed for up to 30 days, Disp: 60 tablet, Rfl: 0 .  zolpidem (AMBIEN CR) 6.25 MG CR tablet, Take 1 tablet (6.25 mg total) by mouth nightly as needed for Sleep, Disp: 20 tablet, Rfl: 0  Review of Systems: No SOB, no palpitations or chest pain, no new lower extremity edema, no nausea or vomiting or bowel or bladder complaints. See HPI for gyn specific ROS.   Exam:   BP 128/89   Ht 157.5 cm (5\' 2" )   Wt 98.5 kg (217 lb 3.2 oz)   LMP 10/27/2018 (Exact Date)   BMI 39.73 kg/m   Constitutional:  General appearance: Well nourished, well developed female. +Appears anxious  Neuro/psych:  Normal mood and affect. No gross motor deficits. +Appears anxious Neck:  Supple, normal appearance.  Respiratory:  Normal respiratory effort, no use of accessory muscles Skin:  No visible rashes or external lesions  Impression:   The primary encounter diagnosis was Primary dysmenorrhea. A diagnosis of Anxiety was also pertinent to  this visit.  Plan:   1. Primary Dysmenorrhea ddx Endometriosis -Ovarian suppression with hormonal methods were reviewed, both combination (pill, patch, vaginal ring) and progesterone-only (pill, Depo Provera and Nexplanon), intrauterine devices (Mirena, Skyla and Paraguard). The mechanisms, risks, benefits and side effects of all methods were discussed.  All questions have been answered to her satisfaction. -Patient asked if there is a surgical option; she is specifically worried about weight gain on a birth control pill.  -I discussed with the patient that surgery is an option; I would plan for a Diagnostic Laparoscopy to look for possible endometriosis or other gynecological pathologies -Patient would like to move forward with surgery; we will  schedule today. She declines IUD placement under anesthesia.  Patient returns for a preoperative discussion regarding her plans to proceed with surgical management of her primary dysmenorrha by dx lap procedure. She is aware that one of the risks of this surgery for this indication is that I won't be able to dx her pain. She strongly declines any hormonal contraception.   The patient and I discussed the technical aspects of the procedure including the potential for risks and complications. These include but are not limited to the risk of infection requiring post-operative antibiotics or further procedures. We talked about the risk of injury to adjacent organs including bladder, bowel, ureter, blood vessels or nerves. We talked about the need to convert to an open incision. We talked about the possible need for blood transfusion. We talked aboutpostop complications such asthromboembolic or cardiopulmonary complications. All of her questions were answered.  Her preoperative exam was completed and the appropriate consents were signed. She is scheduled to undergo this procedure in the near future.  Specific Peri-operative Considerations:  - Consent: obtained today - Labs: CBC, CMP preoperatively - Studies: EKG, CXR preoperatively - Bowel Preparation: None required - Abx:  None indicated - VTE ppx: SCDs perioperatively  I have encouraged her to apply for [facility], but I don't know logistically how to send her to do that.   2. Anxious -Patient appears anxious at visit today; when asked, she reports that she suffers from anxiety and has been treated in the past with Klonopin, Ambien, and Celexa, which helped her back at her home in [place]. However, she was not able to obtain these medications here with a psychiatrist. She was given another anti anxiety medication that made her symptoms worse. I discussed with her that I may give her medication for her anxiety if she would like and  she stated she would like this option. -I will send in Celexa 20mg , Klonopin 0.25mg , and Ambien 6.25mg . We have talked about the risks of these medications. Her anxiety level is high and impedes her life. These combination of medicines in the past helped a lot.  Diagnoses and all orders for this visit:  Primary dysmenorrhea  Anxiety  Other orders -     citalopram (CELEXA) 20 MG tablet; Take 1 tablet (20 mg total) by mouth once daily -     clonazePAM (KLONOPIN) 0.25 MG disintegrating tablet; Take 1 tablet (0.25 mg total) by mouth 2 (two) times daily as needed for up to 30 days -     zolpidem (AMBIEN CR) 6.25 MG CR tablet; Take 1 tablet (6.25 mg total) by mouth nightly as needed for Sleep

## 2018-11-13 NOTE — Patient Instructions (Signed)
Your procedure is scheduled on: 11/15/2018 Fri Report to Same Day Surgery 2nd floor medical mall [facility] Entrance-take elevator on left to 2nd floor.  Check in with surgery information desk.) To find out your arrival time please call [phone] between 1PM - 3PM on 11/14/2018 Thurs   Remember: Instructions that are not followed completely may result in serious medical risk, up to and including death, or upon the discretion of your surgeon and anesthesiologist your surgery may need to be rescheduled.    _x___ 1. Do not eat food after midnight the night before your procedure. You may drink clear liquids up to 2 hours before you are scheduled to arrive at the hospital for your procedure.  Do not drink clear liquids within 2 hours of your scheduled arrival to the hospital.  Clear liquids include  --Water or Apple juice without pulp  --Clear carbohydrate beverage such as ClearFast or Gatorade  --Black Coffee or Clear Tea (No milk, no creamers, do not add anything to                  the coffee or Tea Type 1 and type 2 diabetics should only drink water.   ____Ensure clear carbohydrate drink on the way to the hospital for bariatric patients  ____Ensure clear carbohydrate drink 3 hours before surgery.   No gum chewing or hard candies.     __x__ 2. No Alcohol for 24 hours before or after surgery.   __x__3. No Smoking or e-cigarettes for 24 prior to surgery.  Do not use any chewable tobacco products for at least 6 hour prior to surgery   ____  4. Bring all medications with you on the day of surgery if instructed.    __x__ 5. Notify your doctor if there is any change in your medical condition     (cold, fever, infections).    x___6. On the morning of surgery brush your teeth with toothpaste and water.  You may rinse your mouth with mouth wash if you wish.  Do not swallow any toothpaste or mouthwash.   Do not wear jewelry, make-up, hairpins, clips or nail polish.  Do not wear lotions,  powders, or perfumes. You may wear deodorant.  Do not shave 48 hours prior to surgery. Men may shave face and neck.  Do not bring valuables to the hospital.    [facility] [address] is not responsible for any belongings or valuables.               Contacts, dentures or bridgework may not be worn into surgery.  Leave your suitcase in the car. After surgery it may be brought to your room.  For patients admitted to the hospital, discharge time is determined by your                       treatment team.  _  Patients discharged the day of surgery will not be allowed to drive home.  You will need someone to drive you home and stay with you the night of your procedure.    Please read over the following fact sheets that you were given:   [facility] Preparing for Surgery and or MRSA Information   _x___ Take anti-hypertensive listed below, cardiac, seizure, asthma,     anti-reflux and psychiatric medicines. These include:  1. None  2.  3.  4.  5.  6.  ____Fleets enema or Magnesium Citrate as directed.   _x___ Use CHG Soap  or sage wipes as directed on instruction sheet   ____ Use inhalers on the day of surgery and bring to hospital day of surgery  ____ Stop Metformin and Janumet 2 days prior to surgery.    ____ Take 1/2 of usual insulin dose the night before surgery and none on the morning     surgery.   _x___ Follow recommendations from Cardiologist, Pulmonologist or PCP regarding          stopping Aspirin, Coumadin, Plavix ,Eliquis, Effient, or Pradaxa, and Pletal.  X____Stop Anti-inflammatories such as Advil, Aleve, Ibuprofen, Motrin, Naproxen, Naprosyn, Goodies powders or aspirin products. OK to take Tylenol and                          Celebrex.   _x___ Stop supplements until after surgery.  But may continue Vitamin D, Vitamin B,       and multivitamin.   ____ Bring C-Pap to the hospital.

## 2018-11-15 ENCOUNTER — Ambulatory Visit
Admission: RE | Admit: 2018-11-15 | Discharge: 2018-11-15 | Disposition: A | Discharge status: Home / Self Care | Payer: Self-pay | Attending: Obstetrics and Gynecology | Admitting: Obstetrics and Gynecology

## 2018-11-15 ENCOUNTER — Ambulatory Visit: Payer: Self-pay | Admitting: Anesthesiology

## 2018-11-15 ENCOUNTER — Encounter: Payer: Self-pay | Admitting: *Deleted

## 2018-11-15 ENCOUNTER — Other Ambulatory Visit: Payer: Self-pay

## 2018-11-15 ENCOUNTER — Encounter
Admission: RE | Disposition: A | Discharge status: Home / Self Care | Payer: Self-pay | Source: Home / Self Care | Attending: Obstetrics and Gynecology

## 2018-11-15 DIAGNOSIS — N736 Female pelvic peritoneal adhesions (postinfective): Secondary | ICD-10-CM | POA: Insufficient documentation

## 2018-11-15 DIAGNOSIS — Z9851 Tubal ligation status: Secondary | ICD-10-CM | POA: Insufficient documentation

## 2018-11-15 DIAGNOSIS — F419 Anxiety disorder, unspecified: Secondary | ICD-10-CM | POA: Insufficient documentation

## 2018-11-15 DIAGNOSIS — N83201 Unspecified ovarian cyst, right side: Secondary | ICD-10-CM | POA: Insufficient documentation

## 2018-11-15 DIAGNOSIS — R1909 Other intra-abdominal and pelvic swelling, mass and lump: Secondary | ICD-10-CM | POA: Insufficient documentation

## 2018-11-15 DIAGNOSIS — Z791 Long term (current) use of non-steroidal anti-inflammatories (NSAID): Secondary | ICD-10-CM | POA: Insufficient documentation

## 2018-11-15 DIAGNOSIS — Z79899 Other long term (current) drug therapy: Secondary | ICD-10-CM | POA: Insufficient documentation

## 2018-11-15 DIAGNOSIS — Z9049 Acquired absence of other specified parts of digestive tract: Secondary | ICD-10-CM | POA: Insufficient documentation

## 2018-11-15 DIAGNOSIS — N946 Dysmenorrhea, unspecified: Secondary | ICD-10-CM | POA: Insufficient documentation

## 2018-11-15 DIAGNOSIS — N83202 Unspecified ovarian cyst, left side: Secondary | ICD-10-CM | POA: Insufficient documentation

## 2018-11-15 HISTORY — PX: LAPAROSCOPY: SHX197

## 2018-11-15 HISTORY — PX: LYSIS OF ADHESION: SHX5961

## 2018-11-15 LAB — TYPE AND SCREEN
ABO/RH(D): O POS
Antibody Screen: NEGATIVE

## 2018-11-15 LAB — POCT PREGNANCY, URINE: Preg Test, Ur: NEGATIVE

## 2018-11-15 LAB — ABO/RH: ABO/RH(D): O POS

## 2018-11-15 SURGERY — LAPAROSCOPY, DIAGNOSTIC
Anesthesia: General

## 2018-11-15 MED ORDER — ACETAMINOPHEN 500 MG PO TABS
1000.0000 mg | ORAL_TABLET | ORAL | Status: AC
  Administered 2018-11-15: 1000 mg via ORAL

## 2018-11-15 MED ORDER — MEPERIDINE HCL 50 MG/ML IJ SOLN: 6.2500 mg | INTRAMUSCULAR | Status: DC | PRN

## 2018-11-15 MED ORDER — KETOROLAC TROMETHAMINE 30 MG/ML IJ SOLN
INTRAMUSCULAR | Status: DC | PRN
  Administered 2018-11-15: 30 mg via INTRAVENOUS

## 2018-11-15 MED ORDER — PROPOFOL 10 MG/ML IV BOLUS
INTRAVENOUS | Status: DC | PRN
  Administered 2018-11-15: 150 mg via INTRAVENOUS

## 2018-11-15 MED ORDER — MIDAZOLAM HCL 2 MG/2ML IJ SOLN
INTRAMUSCULAR | Status: AC
  Filled 2018-11-15: qty 2

## 2018-11-15 MED ORDER — OXYCODONE HCL 5 MG/5ML PO SOLN
ORAL | Status: AC
  Filled 2018-11-15: qty 5

## 2018-11-15 MED ORDER — ONDANSETRON HCL 4 MG/2ML IJ SOLN
INTRAMUSCULAR | Status: DC | PRN
  Administered 2018-11-15: 4 mg via INTRAVENOUS

## 2018-11-15 MED ORDER — MIDAZOLAM HCL 2 MG/2ML IJ SOLN
INTRAMUSCULAR | Status: DC | PRN
  Administered 2018-11-15: 2 mg via INTRAVENOUS

## 2018-11-15 MED ORDER — SUGAMMADEX SODIUM 200 MG/2ML IV SOLN
INTRAVENOUS | Status: DC | PRN
  Administered 2018-11-15: 200 mg via INTRAVENOUS

## 2018-11-15 MED ORDER — ROCURONIUM BROMIDE 100 MG/10ML IV SOLN
INTRAVENOUS | Status: DC | PRN
  Administered 2018-11-15: 40 mg via INTRAVENOUS
  Administered 2018-11-15: 10 mg via INTRAVENOUS

## 2018-11-15 MED ORDER — GABAPENTIN 800 MG PO TABS: 800.0000 mg | ORAL_TABLET | Freq: Every day | ORAL | 0 refills | Status: AC

## 2018-11-15 MED ORDER — OXYCODONE HCL 5 MG PO TABS: 5.0000 mg | ORAL_TABLET | Freq: Once | ORAL | Status: AC | PRN

## 2018-11-15 MED ORDER — FENTANYL CITRATE (PF) 100 MCG/2ML IJ SOLN
INTRAMUSCULAR | Status: AC
  Filled 2018-11-15: qty 2

## 2018-11-15 MED ORDER — OXYCODONE HCL 5 MG PO CAPS: 5.0000 mg | ORAL_CAPSULE | Freq: Four times a day (QID) | ORAL | 0 refills | Status: DC | PRN

## 2018-11-15 MED ORDER — FAMOTIDINE 20 MG PO TABS
20.0000 mg | ORAL_TABLET | Freq: Once | ORAL | Status: AC
  Administered 2018-11-15: 20 mg via ORAL

## 2018-11-15 MED ORDER — FENTANYL CITRATE (PF) 100 MCG/2ML IJ SOLN
INTRAMUSCULAR | Status: DC | PRN
  Administered 2018-11-15 (×2): 50 ug via INTRAVENOUS

## 2018-11-15 MED ORDER — DEXAMETHASONE SODIUM PHOSPHATE 10 MG/ML IJ SOLN
INTRAMUSCULAR | Status: DC | PRN
  Administered 2018-11-15: 10 mg via INTRAVENOUS

## 2018-11-15 MED ORDER — PROMETHAZINE HCL 25 MG/ML IJ SOLN: 6.2500 mg | INTRAMUSCULAR | Status: DC | PRN

## 2018-11-15 MED ORDER — LACTATED RINGERS IV SOLN
INTRAVENOUS | Status: DC
  Administered 2018-11-15: 10:00:00 via INTRAVENOUS

## 2018-11-15 MED ORDER — LACTATED RINGERS IV SOLN: INTRAVENOUS | Status: DC

## 2018-11-15 MED ORDER — PROPOFOL 10 MG/ML IV BOLUS
INTRAVENOUS | Status: AC
  Filled 2018-11-15: qty 20

## 2018-11-15 MED ORDER — BUPIVACAINE HCL 0.5 % IJ SOLN
INTRAMUSCULAR | Status: DC | PRN
  Administered 2018-11-15: 11 mL

## 2018-11-15 MED ORDER — PHENYLEPHRINE HCL (PRESSORS) 10 MG/ML IV SOLN
INTRAVENOUS | Status: DC | PRN
  Administered 2018-11-15: 100 ug via INTRAVENOUS

## 2018-11-15 MED ORDER — ACETAMINOPHEN 500 MG PO TABS
ORAL_TABLET | ORAL | Status: AC
  Administered 2018-11-15: 1000 mg via ORAL
  Filled 2018-11-15: qty 2

## 2018-11-15 MED ORDER — HEMOSTATIC AGENTS (NO CHARGE) OPTIME
TOPICAL | Status: DC | PRN
  Administered 2018-11-15: 1 via TOPICAL

## 2018-11-15 MED ORDER — IBUPROFEN 800 MG PO TABS: 800.0000 mg | ORAL_TABLET | Freq: Three times a day (TID) | ORAL | 1 refills | Status: AC

## 2018-11-15 MED ORDER — GABAPENTIN 300 MG PO CAPS: 300.0000 mg | ORAL_CAPSULE | ORAL | Status: DC

## 2018-11-15 MED ORDER — BUPIVACAINE HCL (PF) 0.5 % IJ SOLN
INTRAMUSCULAR | Status: AC
  Filled 2018-11-15: qty 30

## 2018-11-15 MED ORDER — LIDOCAINE HCL (CARDIAC) PF 100 MG/5ML IV SOSY
PREFILLED_SYRINGE | INTRAVENOUS | Status: DC | PRN
  Administered 2018-11-15: 100 mg via INTRAVENOUS

## 2018-11-15 MED ORDER — FENTANYL CITRATE (PF) 100 MCG/2ML IJ SOLN
25.0000 ug | INTRAMUSCULAR | Status: DC | PRN
  Administered 2018-11-15: 25 ug via INTRAVENOUS
  Administered 2018-11-15 (×2): 50 ug via INTRAVENOUS

## 2018-11-15 MED ORDER — GABAPENTIN 300 MG PO CAPS
ORAL_CAPSULE | ORAL | Status: AC
  Administered 2018-11-15: 300 mg
  Filled 2018-11-15: qty 1

## 2018-11-15 MED ORDER — DOCUSATE SODIUM 100 MG PO CAPS: 100.0000 mg | ORAL_CAPSULE | Freq: Two times a day (BID) | ORAL | 0 refills | Status: AC

## 2018-11-15 MED ORDER — ACETAMINOPHEN 500 MG PO TABS: 1000.0000 mg | ORAL_TABLET | Freq: Four times a day (QID) | ORAL | 0 refills | Status: AC

## 2018-11-15 MED ORDER — FAMOTIDINE 20 MG PO TABS
ORAL_TABLET | ORAL | Status: AC
  Administered 2018-11-15: 09:00:00 20 mg via ORAL
  Filled 2018-11-15: qty 1

## 2018-11-15 MED ORDER — OXYCODONE HCL 5 MG/5ML PO SOLN
5.0000 mg | Freq: Once | ORAL | Status: AC | PRN
  Administered 2018-11-15: 5 mg via ORAL

## 2018-11-15 SURGICAL SUPPLY — 50 items
APPLICATOR ARISTA FLEXITIP XL (MISCELLANEOUS) ×2 IMPLANT
BAG URINE DRAINAGE (UROLOGICAL SUPPLIES) ×4 IMPLANT
BLADE SURG SZ11 CARB STEEL (BLADE) ×4 IMPLANT
CATH FOLEY 2WAY  5CC 16FR (CATHETERS) ×2
CATH URTH 16FR FL 2W BLN LF (CATHETERS) ×2 IMPLANT
CHLORAPREP W/TINT 26 (MISCELLANEOUS) ×4 IMPLANT
CLOSURE WOUND 1/4X4 (GAUZE/BANDAGES/DRESSINGS) ×1
CORD MONOPOLAR M/FML 12FT (MISCELLANEOUS) ×2 IMPLANT
COVER WAND RF STERILE (DRAPES) IMPLANT
DERMABOND ADVANCED (GAUZE/BANDAGES/DRESSINGS) ×2
DERMABOND ADVANCED .7 DNX12 (GAUZE/BANDAGES/DRESSINGS) ×2 IMPLANT
DRAPE GENERAL ENDO 106X123.5 (DRAPES) ×4 IMPLANT
DRAPE LEGGINS SURG 28X43 STRL (DRAPES) ×4 IMPLANT
DRAPE STERI POUCH LG 24X46 STR (DRAPES) IMPLANT
DRAPE UNDER BUTTOCK W/FLU (DRAPES) ×4 IMPLANT
GLOVE BIO SURGEON STRL SZ7 (GLOVE) ×10 IMPLANT
GLOVE INDICATOR 7.5 STRL GRN (GLOVE) ×4 IMPLANT
GOWN STRL REUS W/ TWL LRG LVL3 (GOWN DISPOSABLE) ×4 IMPLANT
GOWN STRL REUS W/TWL LRG LVL3 (GOWN DISPOSABLE) ×4
HEMOSTAT ARISTA ABSORB 3G PWDR (HEMOSTASIS) ×2 IMPLANT
IRRIGATION STRYKERFLOW (MISCELLANEOUS) ×2 IMPLANT
IRRIGATOR STRYKERFLOW (MISCELLANEOUS) ×4
IV NS 1000ML (IV SOLUTION) ×2
IV NS 1000ML BAXH (IV SOLUTION) ×2 IMPLANT
KIT PINK PAD W/HEAD ARE REST (MISCELLANEOUS) ×4
KIT PINK PAD W/HEAD ARM REST (MISCELLANEOUS) ×2 IMPLANT
KIT TURNOVER CYSTO (KITS) ×4 IMPLANT
LABEL OR SOLS (LABEL) ×2 IMPLANT
LIGASURE LAP MARYLAND 5MM 37CM (ELECTROSURGICAL) IMPLANT
LIGASURE VESSEL 5MM BLUNT TIP (ELECTROSURGICAL) ×2 IMPLANT
NDL FILTER BLUNT 18X1 1/2 (NEEDLE) ×2 IMPLANT
NEEDLE FILTER BLUNT 18X 1/2SAF (NEEDLE) ×2
NEEDLE FILTER BLUNT 18X1 1/2 (NEEDLE) ×2 IMPLANT
NS IRRIG 500ML POUR BTL (IV SOLUTION) ×4 IMPLANT
PACK GYN LAPAROSCOPIC (MISCELLANEOUS) ×4 IMPLANT
PAD OB MATERNITY 4.3X12.25 (PERSONAL CARE ITEMS) ×4 IMPLANT
PAD PREP 24X41 OB/GYN DISP (PERSONAL CARE ITEMS) ×4 IMPLANT
POUCH SPECIMEN RETRIEVAL 10MM (ENDOMECHANICALS) IMPLANT
SCISSORS METZENBAUM CVD 33 (INSTRUMENTS) ×2 IMPLANT
SET TUBE SMOKE EVAC HIGH FLOW (TUBING) ×4 IMPLANT
SLEEVE ENDOPATH XCEL 5M (ENDOMECHANICALS) ×6 IMPLANT
STRIP CLOSURE SKIN 1/4X4 (GAUZE/BANDAGES/DRESSINGS) ×3 IMPLANT
SUT MNCRL AB 4-0 PS2 18 (SUTURE) ×4 IMPLANT
SUT VIC AB 2-0 UR6 27 (SUTURE) ×4 IMPLANT
SUT VIC AB 4-0 SH 27 (SUTURE) ×2
SUT VIC AB 4-0 SH 27XANBCTRL (SUTURE) ×2 IMPLANT
SYR 50ML LL SCALE MARK (SYRINGE) IMPLANT
SYR 5ML LL (SYRINGE) ×4 IMPLANT
TROCAR XCEL NON-BLD 5MMX100MML (ENDOMECHANICALS) ×4 IMPLANT
TUBING ART PRESS 48 MALE/FEM (TUBING) IMPLANT

## 2018-11-15 NOTE — Anesthesia Post-op Follow-up Note (Signed)
Anesthesia QCDR form completed.        

## 2018-11-15 NOTE — Discharge Instructions (Addendum)
Laparoscopic Ovarian Surgery Discharge Instructions  For the next three days, take ibuprofen and acetaminophen on a schedule, every 8 hours. You can take them together or you can intersperse them, and take one every four hours. I also gave you gabapentin for nighttime, to help you sleep and also to control pain. Take gabapentin medicines at night for at least the next 3 nights. You also have a narcotic, oxycodone, to take as needed if the above medicines don't help.  Postop constipation is a major cause of pain. Stay well hydrated, walk as you tolerate, and take over the counter senna as well as stool softeners if you need them.   RISKS AND COMPLICATIONS   Infection.  Bleeding.  Injury to surrounding organs.  Anesthetic side effects.   PROCEDURE   You may be given a medicine to help you relax (sedative) before the procedure. You will be given a medicine to make you sleep (general anesthetic) during the procedure.  A tube will be put down your throat to help your breath while under general anesthesia.  Several small cuts (incisions) are made in the lower abdominal area and one incision is made near the belly button.  Your abdominal area will be inflated with a safe gas (carbon dioxide). This helps give the surgeon room to operate, visualize, and helps the surgeon avoid other organs.  A thin, lighted tube (laparoscope) with a camera attached is inserted into your abdomen through the incision near the belly button. Other small instruments may also be inserted through other abdominal incisions.  The ovary is located and are removed.  After the ovary is removed, the gas is released from the abdomen.  The incisions will be closed with stitches (sutures), and Dermabond. A bandage may be placed over the incisions.  AFTER THE PROCEDURE   You will also have some mild abdominal discomfort for 3-7 days. You will be given pain medicine to ease any discomfort.  As long as there are no  problems, you may be allowed to go home. Someone will need to drive you home and be with you for at least 24 hours once home.  You may have some mild discomfort in the throat. This is from the tube placed in your throat while you were sleeping.  You may experience discomfort in the shoulder area from some trapped air between the liver and diaphragm. This sensation is normal and will slowly go away on its own.  HOME CARE INSTRUCTIONS   Take all medicines as directed.  Only take over-the-counter or prescription medicines for pain, discomfort, or fever as directed by your caregiver.  Resume daily activities as directed.  Showers are preferred over baths for 2 weeks.  You may resume sexual activities in 1 week or as you feel you would like to.  Do not drive while taking narcotics.  SEEK MEDICAL CARE IF: .  There is increasing abdominal pain.  You feel lightheaded or faint.  You have the chills.  You have an oral temperature above 102 F (38.9 C).  There is pus-like (purulent) drainage from any of the wounds.  You are unable to pass gas or have a bowel movement.  You feel sick to your stomach (nauseous) or throw up (vomit) and can't control it with your medicines.  MAKE SURE YOU:   Understand these instructions.  Will watch your condition.  Will get help right away if you are not doing well or get worse.  ExitCare Patient Information 2013 [employer]     AMBULATORY SURGERY  DISCHARGE INSTRUCTIONS   1) The drugs that you were given will stay in your system until tomorrow so for the next 24 hours you should not:  A) Drive an automobile B) Make any legal decisions C) Drink any alcoholic beverage   2) You may resume regular meals tomorrow.  Today it is better to start with liquids and gradually work up to solid foods.  You may eat anything you prefer, but it is better to start with liquids, then soup and crackers, and gradually work up to solid  foods.   3) Please notify your doctor immediately if you have any unusual bleeding, trouble breathing, redness and pain at the surgery site, drainage, fever, or pain not relieved by medication.    4) Additional Instructions:        Please contact your physician with any problems or Same Day Surgery at [phone], Monday through Friday 6 am to 4 pm, or [place] [facility] [place] Main number at [phone].   

## 2018-11-15 NOTE — Anesthesia Procedure Notes (Signed)
Procedure Name: Intubation Date/Time: 11/15/2018 10:29 AM Performed by: Justus Memory, CRNA Pre-anesthesia Checklist: Patient identified, Patient being monitored, Timeout performed, Emergency Drugs available and Suction available Patient Re-evaluated:Patient Re-evaluated prior to induction Oxygen Delivery Method: Circle system utilized Preoxygenation: Pre-oxygenation with 100% oxygen Induction Type: IV induction Ventilation: Mask ventilation without difficulty Laryngoscope Size: Mac and 3 Grade View: Grade I Tube type: Oral Tube size: 7.0 mm Number of attempts: 1 Airway Equipment and Method: Stylet Placement Confirmation: ETT inserted through vocal cords under direct vision,  positive ETCO2 and breath sounds checked- equal and bilateral Secured at: 21 cm Tube secured with: Tape Dental Injury: Teeth and Oropharynx as per pre-operative assessment

## 2018-11-15 NOTE — Interval H&P Note (Signed)
History and Physical Interval Note:  11/15/2018 10:08 AM  Carmen Francis  has presented today for surgery, with the diagnosis of primary dysmenorrhea, resistant to conservative managment.  The various methods of treatment have been discussed with the patient and family. After consideration of risks, benefits and other options for treatment, the patient has consented to  Procedure(s): LAPAROSCOPY DIAGNOSTIC (N/A) with peritoneal biopsies and other indicated procedures as a surgical intervention.  The patient's history has been reviewed, patient examined, no change in status, stable for surgery.  I have reviewed the patient's chart and labs.  Questions were answered to the patient's satisfaction.     Benjaman Kindler

## 2018-11-15 NOTE — Anesthesia Postprocedure Evaluation (Signed)
Anesthesia Post Note  Patient: Carmen Francis  Procedure(s) Performed: LAPAROSCOPY operative, left salpingectomy (N/A ) LYSIS OF ADHESION  Patient location during evaluation: PACU Anesthesia Type: General Level of consciousness: awake and alert and oriented Pain management: pain level controlled Vital Signs Assessment: post-procedure vital signs reviewed and stable Respiratory status: spontaneous breathing, nonlabored ventilation and respiratory function stable Cardiovascular status: blood pressure returned to baseline and stable Postop Assessment: no signs of nausea or vomiting Anesthetic complications: no     Last Vitals:  Vitals:   11/15/18 1216 11/15/18 1231  BP: 122/75 103/64  Pulse: 80 67  Resp: (!) 24 20  Temp:    SpO2: 100% 100%    Last Pain:  Vitals:   11/15/18 1231  TempSrc:   PainSc: Asleep                 Bora Bost

## 2018-11-15 NOTE — Transfer of Care (Signed)
Immediate Anesthesia Transfer of Care Note  Patient: Carmen Francis  Procedure(s) Performed: LAPAROSCOPY operative, left salpingectomy (N/A ) LYSIS OF ADHESION  Patient Location: PACU  Anesthesia Type:General  Level of Consciousness: sedated  Airway & Oxygen Therapy: Patient Spontanous Breathing and Patient connected to face mask oxygen  Post-op Assessment: Report given to RN and Post -op Vital signs reviewed and stable  Post vital signs: Reviewed and stable  Last Vitals:  Vitals Value Taken Time  BP 114/66 11/15/18 1201  Temp 37.3 C 11/15/18 1201  Pulse 83 11/15/18 1201  Resp 24 11/15/18 1206  SpO2 100 % 11/15/18 1201  Vitals shown include unvalidated device data.  Last Pain:  Vitals:   11/15/18 0908  TempSrc: Tympanic  PainSc: 5       Patients Stated Pain Goal: 3 (42/59/56 [id number])  Complications: No apparent anesthesia complications

## 2018-11-15 NOTE — Anesthesia Preprocedure Evaluation (Signed)
Anesthesia Evaluation  Patient identified by MRN, date of birth, ID band Patient awake    Reviewed: Allergy & Precautions, NPO status , Patient's Chart, lab work & pertinent test results  History of Anesthesia Complications Negative for: history of anesthetic complications  Airway Mallampati: III  TM Distance: >3 FB Neck ROM: Full    Dental no notable dental hx.    Pulmonary neg pulmonary ROS, neg sleep apnea, neg COPD,    breath sounds clear to auscultation- rhonchi (-) wheezing      Cardiovascular Exercise Tolerance: Good (-) hypertension(-) CAD, (-) Past MI, (-) Cardiac Stents and (-) CABG  Rhythm:Regular Rate:Normal - Systolic murmurs and - Diastolic murmurs    Neuro/Psych neg Seizures Anxiety negative neurological ROS     GI/Hepatic negative GI ROS, Neg liver ROS,   Endo/Other  negative endocrine ROSneg diabetes  Renal/GU negative Renal ROS     Musculoskeletal negative musculoskeletal ROS (+)   Abdominal (+) + obese,   Peds  Hematology negative hematology ROS (+)   Anesthesia Other Findings Past Medical History: No date: Abdominal pain     Comment:  Right lower No date: Anxiety No date: Hyperglycemia   Reproductive/Obstetrics                             Anesthesia Physical Anesthesia Plan  ASA: II  Anesthesia Plan: General   Post-op Pain Management:    Induction: Intravenous  PONV Risk Score and Plan: 2 and Dexamethasone, Ondansetron and Midazolam  Airway Management Planned: Oral ETT  Additional Equipment:   Intra-op Plan:   Post-operative Plan: Extubation in OR  Informed Consent: I have reviewed the patients History and Physical, chart, labs and discussed the procedure including the risks, benefits and alternatives for the proposed anesthesia with the patient or authorized representative who has indicated his/her understanding and acceptance.     Dental advisory  given  Plan Discussed with: CRNA and Anesthesiologist  Anesthesia Plan Comments:         Anesthesia Quick Evaluation

## 2018-11-15 NOTE — Op Note (Addendum)
Carmen RiserStephanie Francis PROCEDURE DATE: 11/15/2018  INDICATIONS: Acute pelvic pain  PREOPERATIVE DIAGNOSIS: Pelvic pain  POSTOPERATIVE DIAGNOSIS: Adnexal mass on left, hx of pelvic pain, adhesions PROCEDURE: Exam under anesthesia, operative laparoscopy, lysis of adhesions, left salpingectomy SURGEON:  Dr. Christeen DouglasBethany Isabell Bonafede ASSISTANT: CST ANESTHESIOLOGIST: Alver FisherPenwarden, Amy, MD Anesthesiologist: Alver FisherPenwarden, Amy, MD CRNA: Almeta MonasFletcher, Patricia, CRNA; Ginger CarneMichelet, Earma, CRNA  INDICATIONS: 33 y.o. F with history of acute-onset pelvic pain desiring surgical evaluation.   Please see preoperative notes for further details.  Risks of surgery were discussed with the patient including but not limited to: bleeding which may require transfusion or reoperation; infection which may require antibiotics; injury to bowel, bladder, ureters or other surrounding organs; need for additional procedures including laparotomy; thromboembolic phenomenon, incisional problems and other postoperative/anesthesia complications. Written informed consent was obtained.    FINDINGS:  Small uterus, normal right ovary and fallopian tube. Left sided fallopian tube cyst with dense adhesions to the left pelvic side wall.Ovarian cyst on right side. No other abdominal/pelvic abnormality.  Normal upper abdomen.  No evidence of peritoneal endometriosis, beyond multiple sites of adhesion between the pelvic organs and the pelvic sidewall.  Her cesarean section adhesions were present but moderately dense.  Her posterior cul-de-sac was free from endometriosis, as were her bilateral uterosacral ligaments and bilateral ovarian fossae.    ANESTHESIA:    General INTRAVENOUS FLUIDS: 800 ml ESTIMATED BLOOD LOSS: 25 ml HEMOPERITONEUM: 0 URINE OUTPUT: 200 ml SPECIMENS: Left tube and cyst COMPLICATIONS: None immediate  PROCEDURE IN DETAIL:  The patient had sequential compression devices applied to her lower extremities while in the preoperative area.  She  was then taken to the operating room where general anesthesia was administered and was found to be adequate.  She was placed in the dorsal lithotomy position, and was prepped and draped in a sterile manner.  A Foley catheter was inserted into her bladder and attached to constant drainage and a uterine manipulator was then advanced into the uterus .  After an adequate timeout was performed, attention was turned to the abdomen where an umbilical incision was made with the scalpel.  The Optiview 5-mm trocar and sleeve were then advanced without difficulty with the laparoscope under direct visualization into the abdomen.  The abdomen was then insufflated with carbon dioxide gas and adequate pneumoperitoneum was obtained.   A detailed survey of the patient's pelvis and abdomen revealed the findings as mentioned above.  A LigaSure device was used to remove right lower pelvic adhesions, prior to placement of the right lower quadrant port. Two additional 5mm trocars were placed in the bilateral lower quadrants under direct visualization.  On the left, the left fallopian tube and tubal cyst was apparent beneath deep adhesions superiorly to the ovary and above the plane of the round ligament.  This whole complex was carefully dissected away from the pelvic sidewall, being careful to avoid the bowel and deeper into the peritoneum.  Hemostasis was assured with electrocautery. The pelvis was irrigated and all fluid and blood removed.  The specimens were removed from the left lower quadrant port.  Left ovarian cyst ruptured for active hemorrhagic fluid, which was controlled with electrocautery and hemostatic agent in the cyst.  The operative site was surveyed, and it was found to be hemostatic.  No intraoperative injury to surrounding organs was noted.   Pictures were taken of the quadrants and pelvis. The abdomen was desufflated and all instruments were then removed from the patient's abdomen. The uterine manipulator was  removed without  complications.  All incisions were closed with 4-0 Vicryl and Dermabond.   The patient tolerated the procedures well.  All instruments, needles, and sponge counts were correct x 2. The patient was taken to the recovery room in stable condition.

## 2018-11-16 ENCOUNTER — Encounter: Payer: Self-pay | Admitting: Obstetrics and Gynecology

## 2018-11-18 LAB — SURGICAL PATHOLOGY

## 2018-12-02 ENCOUNTER — Other Ambulatory Visit: Payer: Self-pay

## 2018-12-02 DIAGNOSIS — Z20822 Contact with and (suspected) exposure to covid-19: Secondary | ICD-10-CM

## 2018-12-05 LAB — NOVEL CORONAVIRUS, NAA: SARS-CoV-2, NAA: DETECTED — AB

## 2019-02-11 ENCOUNTER — Emergency Department
Admission: EM | Admit: 2019-02-11 | Discharge: 2019-02-11 | Disposition: A | Discharge status: Home / Self Care | Payer: Self-pay | Attending: Emergency Medicine | Admitting: Emergency Medicine

## 2019-02-11 ENCOUNTER — Emergency Department: Payer: Self-pay

## 2019-02-11 ENCOUNTER — Encounter: Payer: Self-pay | Admitting: Emergency Medicine

## 2019-02-11 DIAGNOSIS — Z79899 Other long term (current) drug therapy: Secondary | ICD-10-CM | POA: Insufficient documentation

## 2019-02-11 DIAGNOSIS — Z3202 Encounter for pregnancy test, result negative: Secondary | ICD-10-CM | POA: Insufficient documentation

## 2019-02-11 DIAGNOSIS — K529 Noninfective gastroenteritis and colitis, unspecified: Secondary | ICD-10-CM

## 2019-02-11 DIAGNOSIS — R1031 Right lower quadrant pain: Secondary | ICD-10-CM | POA: Insufficient documentation

## 2019-02-11 LAB — CBC WITH DIFFERENTIAL/PLATELET
Abs Immature Granulocytes: 0.03 10*3/uL (ref 0.00–0.07)
Basophils Absolute: 0.1 10*3/uL (ref 0.0–0.1)
Basophils Relative: 1 %
Eosinophils Absolute: 0.2 10*3/uL (ref 0.0–0.5)
Eosinophils Relative: 3 %
HCT: 37.5 % (ref 36.0–46.0)
Hemoglobin: 11.7 g/dL — ABNORMAL LOW (ref 12.0–15.0)
Immature Granulocytes: 0 %
Lymphocytes Relative: 20 %
Lymphs Abs: 1.5 10*3/uL (ref 0.7–4.0)
MCH: 26 pg (ref 26.0–34.0)
MCHC: 31.2 g/dL (ref 30.0–36.0)
MCV: 83.3 fL (ref 80.0–100.0)
Monocytes Absolute: 0.5 10*3/uL (ref 0.1–1.0)
Monocytes Relative: 7 %
Neutro Abs: 5.1 10*3/uL (ref 1.7–7.7)
Neutrophils Relative %: 69 %
Platelets: 358 10*3/uL (ref 150–400)
RBC: 4.5 MIL/uL (ref 3.87–5.11)
RDW: 13.9 % (ref 11.5–15.5)
WBC: 7.4 10*3/uL (ref 4.0–10.5)
nRBC: 0 % (ref 0.0–0.2)

## 2019-02-11 LAB — URINALYSIS, COMPLETE (UACMP) WITH MICROSCOPIC
Bacteria, UA: NONE SEEN
Bilirubin Urine: NEGATIVE
Glucose, UA: NEGATIVE mg/dL
Ketones, ur: NEGATIVE mg/dL
Leukocytes,Ua: NEGATIVE
Nitrite: NEGATIVE
Protein, ur: 30 mg/dL — AB
RBC / HPF: >50 RBC/hpf — ABNORMAL HIGH (ref 0–5)
Specific Gravity, Urine: 1.026 (ref 1.005–1.030)
pH: 5 (ref 5.0–8.0)

## 2019-02-11 LAB — COMPREHENSIVE METABOLIC PANEL
ALT: 16 U/L (ref 0–44)
AST: 23 U/L (ref 15–41)
Albumin: 4.1 g/dL (ref 3.5–5.0)
Alkaline Phosphatase: 63 U/L (ref 38–126)
Anion gap: 7 (ref 5–15)
BUN: 10 mg/dL (ref 6–20)
CO2: 24 mmol/L (ref 22–32)
Calcium: 8.8 mg/dL — ABNORMAL LOW (ref 8.9–10.3)
Chloride: 106 mmol/L (ref 98–111)
Creatinine, Ser: 0.6 mg/dL (ref 0.44–1.00)
GFR calc Af Amer: >60 mL/min (ref >60)
GFR calc non Af Amer: >60 mL/min (ref >60)
Glucose, Bld: 100 mg/dL — ABNORMAL HIGH (ref 70–99)
Potassium: 3.7 mmol/L (ref 3.5–5.1)
Sodium: 137 mmol/L (ref 135–145)
Total Bilirubin: 0.6 mg/dL (ref 0.3–1.2)
Total Protein: 7.6 g/dL (ref 6.5–8.1)

## 2019-02-11 LAB — POCT PREGNANCY, URINE: Preg Test, Ur: NEGATIVE

## 2019-02-11 MED ORDER — ONDANSETRON HCL 4 MG/2ML IJ SOLN
4.0000 mg | Freq: Once | INTRAMUSCULAR | Status: AC
  Administered 2019-02-11: 12:00:00 4 mg via INTRAVENOUS
  Filled 2019-02-11: qty 2

## 2019-02-11 MED ORDER — IOHEXOL 300 MG/ML  SOLN
100.0000 mL | Freq: Once | INTRAMUSCULAR | Status: AC | PRN
  Administered 2019-02-11: 13:00:00 100 mL via INTRAVENOUS
  Filled 2019-02-11: qty 100

## 2019-02-11 MED ORDER — KETOROLAC TROMETHAMINE 30 MG/ML IJ SOLN
30.0000 mg | Freq: Once | INTRAMUSCULAR | Status: AC
  Administered 2019-02-11: 14:00:00 30 mg via INTRAVENOUS

## 2019-02-11 MED ORDER — MORPHINE SULFATE (PF) 4 MG/ML IV SOLN
4.0000 mg | Freq: Once | INTRAVENOUS | Status: AC
  Administered 2019-02-11: 12:00:00 4 mg via INTRAVENOUS
  Filled 2019-02-11: qty 1

## 2019-02-11 MED ORDER — KETOROLAC TROMETHAMINE 60 MG/2ML IM SOLN
30.0000 mg | Freq: Once | INTRAMUSCULAR | Status: DC
  Filled 2019-02-11: qty 2

## 2019-02-11 MED ORDER — CIPROFLOXACIN HCL 500 MG PO TABS: 500.0000 mg | ORAL_TABLET | Freq: Two times a day (BID) | ORAL | 0 refills | Status: DC

## 2019-02-11 MED ORDER — METRONIDAZOLE 500 MG PO TABS: 500.0000 mg | ORAL_TABLET | Freq: Two times a day (BID) | ORAL | 0 refills | Status: DC

## 2019-02-11 MED ORDER — TRAMADOL HCL 50 MG PO TABS: 50.0000 mg | ORAL_TABLET | Freq: Four times a day (QID) | ORAL | 0 refills | Status: AC | PRN

## 2019-02-11 NOTE — ED Notes (Signed)
Patient transported to CT 

## 2019-02-11 NOTE — ED Provider Notes (Signed)
[facility] Emergency Department Provider Note  ____________________________________________   First MD Initiated Contact with Patient 02/11/19 1135     (approximate)  I have reviewed the triage vital signs and the nursing notes.   HISTORY  Chief Complaint No chief complaint on file.    HPI Carmen Francis is a 33 y.o. female presents emergency department complaining of right lower quadrant pain.  Patient states she has a history of ovarian cyst but had them removed.  She states that the lower quadrant pain has increased over the last few months.  She did have Covid in August.  No symptoms now.  No vaginal discharge.  Patient is currently on her menstrual cycle.  She has had several episodes of diarrhea.  Some fever or chills.  Still has her appendix and gallbladder.    Past Medical History:  Diagnosis Date  . Abdominal pain    Right lower  . Anxiety   . Hyperglycemia     There are no active problems to display for this patient.   Past Surgical History:  Procedure Laterality Date  . BREAST SURGERY    . CESAREAN SECTION    . CHOLECYSTECTOMY    . LAPAROSCOPY N/A 11/15/2018   Procedure: LAPAROSCOPY operative, left salpingectomy;  Surgeon: Christeen DouglasBeasley, Bethany, MD;  Location: [facility] ORS;  Service: Gynecology;  Laterality: N/A;  . LYSIS OF ADHESION  11/15/2018   Procedure: LYSIS OF ADHESION;  Surgeon: Christeen DouglasBeasley, Bethany, MD;  Location: [facility] ORS;  Service: Gynecology;;  . TONSILLECTOMY      Prior to Admission medications   Medication Sig Start Date End Date Taking? Authorizing Provider  docusate sodium (COLACE) 100 MG capsule Take 1 capsule (100 mg total) by mouth 2 (two) times daily. To keep stools soft 11/15/18   Christeen DouglasBeasley, Bethany, MD  gabapentin (NEURONTIN) 800 MG tablet Take 1 tablet (800 mg total) by mouth at bedtime for 14 days. Take nightly for 3 days, then up to 14 days as needed 11/15/18 11/29/18  Christeen DouglasBeasley, Bethany, MD  ibuprofen (ADVIL) 800 MG tablet  Take 1 tablet (800 mg total) by mouth every 8 (eight) hours as needed. Patient not taking: Reported on 11/13/2018 10/28/18   Menshew, Charlesetta IvoryJenise V Bacon, PA-C  ondansetron (ZOFRAN ODT) 4 MG disintegrating tablet Take 1 tablet (4 mg total) by mouth every 8 (eight) hours as needed. Patient not taking: Reported on 11/13/2018 10/28/18   Menshew, Charlesetta IvoryJenise V Bacon, PA-C  oxycodone (OXY-IR) 5 MG capsule Take 1 capsule (5 mg total) by mouth every 6 (six) hours as needed for pain. 11/15/18   Christeen DouglasBeasley, Bethany, MD    Allergies Patient has no known allergies.  No family history on file.  Social History Social History   Tobacco Use  . Smoking status: Never Smoker  . Smokeless tobacco: Never Used  Substance Use Topics  . Alcohol use: Not Currently  . Drug use: Never    Review of Systems  Constitutional: No fever/chills Eyes: No visual changes. ENT: No sore throat. Respiratory: Denies cough Gastrointestinal: Positive for right lower quadrant pain Genitourinary: Negative for dysuria. Musculoskeletal: Negative for back pain. Skin: Negative for rash.    ____________________________________________   PHYSICAL EXAM:  VITAL SIGNS: ED Triage Vitals  Enc Vitals Group     BP 02/11/19 1123 124/77     Pulse Rate 02/11/19 1123 85     Resp 02/11/19 1123 18     Temp 02/11/19 1123 98.7 F (37.1 C)     Temp Source 02/11/19 1123  Oral     SpO2 02/11/19 1123 98 %     Weight 02/11/19 1130 190 lb (86.2 kg)     Height 02/11/19 1130 5\' 2"  (1.575 m)     Head Circumference --      Peak Flow --      Pain Score 02/11/19 1130 7     Pain Loc --      Pain Edu? --      Excl. in GC? --     Constitutional: Alert and oriented. Well appearing and in no acute distress. Eyes: Conjunctivae are normal.  Head: Atraumatic. Nose: No congestion/rhinnorhea. Mouth/Throat: Mucous membranes are moist.   Neck:  supple no lymphadenopathy noted Cardiovascular: Normal rate, regular rhythm. Heart sounds are normal  Respiratory: Normal respiratory effort.  No retractions, lungs c t a  Abd: soft tender in the right lower quadrant, rebound tenderness noted, bs normal all 4 quad GU: deferred Musculoskeletal: FROM all extremities, warm and well perfused Neurologic:  Normal speech and language.  Skin:  Skin is warm, dry and intact. No rash noted. Psychiatric: Mood and affect are normal. Speech and behavior are normal.  ____________________________________________   LABS (all labs ordered are listed, but only abnormal results are displayed)  Labs Reviewed  CBC WITH DIFFERENTIAL/PLATELET - Abnormal; Notable for the following components:      Result Value   Hemoglobin 11.7 (*)    All other components within normal limits  URINALYSIS, COMPLETE (UACMP) WITH MICROSCOPIC - Abnormal; Notable for the following components:   Color, Urine YELLOW (*)    APPearance HAZY (*)    Hgb urine dipstick LARGE (*)    Protein, ur 30 (*)    RBC / HPF >50 (*)    All other components within normal limits  COMPREHENSIVE METABOLIC PANEL  POCT PREGNANCY, URINE  POC URINE PREG, ED   ____________________________________________   ____________________________________________  RADIOLOGY  CT abdomen/pelvis with IV contrast  ____________________________________________   PROCEDURES  Procedure(s) performed: Saline lock, morphine 4 mg IV, Zofran 4 mg IV   Procedures    ____________________________________________   INITIAL IMPRESSION / ASSESSMENT AND PLAN / ED COURSE  Pertinent labs & imaging results that were available during my care of the patient were reviewed by me and considered in my medical decision making (see chart for details).   Patient is 33 year old female presents emergency department right lower quadrant pain.  See HPI  Physical exam shows patient appears stable.  Vitals are normal.  Right lower quadrant is tender.  DDx: Acute abdominal pain, acute appendicitis, ovarian cyst  Labs ordered for  CBC comprehensive metabolic panel, POC pregnancy and urinalysis.,  CT abdomen/pelvis IV contrast also ordered.  ----------------------------------------- 12:29 PM on 02/11/2019 -----------------------------------------  Care transferred to Dr. 13/01/2019 was evaluated in Emergency Department on 02/11/2019 for the symptoms described in the history of present illness. She was evaluated in the context of the global COVID-19 pandemic, which necessitated consideration that the patient might be at risk for infection with the SARS-CoV-2 virus that causes COVID-19. Institutional protocols and algorithms that pertain to the evaluation of patients at risk for COVID-19 are in a state of rapid change based on information released by regulatory bodies including the CDC and federal and state organizations. These policies and algorithms were followed during the patient's care in the ED.   As part of my medical decision making, I reviewed the following data within the electronic MEDICAL RECORD NUMBER Nursing notes reviewed and incorporated, Interpreter needed,  Labs reviewed , Old chart reviewed, Evaluated by EM attending dr Corky Downs, Notes from prior ED visits and [place] Controlled Substance Database  ____________________________________________   FINAL CLINICAL IMPRESSION(S) / ED DIAGNOSES  Final diagnoses:  Abdominal pain, RLQ      NEW MEDICATIONS STARTED DURING THIS VISIT:  New Prescriptions   No medications on file     Note:  This document was prepared using Dragon voice recognition software and may include unintentional dictation errors.    Versie Starks, PA-C 02/11/19 1230    Lavonia Drafts, MD 02/11/19 1420

## 2019-02-11 NOTE — ED Triage Notes (Signed)
Per interpreter , Lower right abd pain , currently on menstrual cycle, hx of ovarian cyst,  Increased pain each month since August

## 2019-02-11 NOTE — ED Notes (Signed)
Per interpreter, pt verbalizes understanding of d/c instructions, medications and follow up.

## 2020-06-24 ENCOUNTER — Other Ambulatory Visit: Payer: Self-pay

## 2020-06-24 ENCOUNTER — Emergency Department
Admission: EM | Admit: 2020-06-24 | Discharge: 2020-06-24 | Disposition: A | Discharge status: Home / Self Care | Payer: Self-pay | Attending: Emergency Medicine | Admitting: Emergency Medicine

## 2020-06-24 ENCOUNTER — Encounter: Payer: Self-pay | Admitting: Physician Assistant

## 2020-06-24 ENCOUNTER — Emergency Department: Payer: Self-pay

## 2020-06-24 DIAGNOSIS — J111 Influenza due to unidentified influenza virus with other respiratory manifestations: Secondary | ICD-10-CM | POA: Insufficient documentation

## 2020-06-24 DIAGNOSIS — Z20822 Contact with and (suspected) exposure to covid-19: Secondary | ICD-10-CM | POA: Insufficient documentation

## 2020-06-24 LAB — RESP PANEL BY RT-PCR (FLU A&B, COVID) ARPGX2
Influenza A by PCR: POSITIVE — AB
Influenza B by PCR: NEGATIVE
SARS Coronavirus 2 by RT PCR: NEGATIVE

## 2020-06-24 MED ORDER — ALBUTEROL SULFATE HFA 108 (90 BASE) MCG/ACT IN AERS: 2.0000 | INHALATION_SPRAY | Freq: Four times a day (QID) | RESPIRATORY_TRACT | 0 refills | Status: AC | PRN

## 2020-06-24 MED ORDER — BENZONATATE 100 MG PO CAPS: ORAL_CAPSULE | ORAL | 0 refills | Status: DC

## 2020-06-24 MED ORDER — ONDANSETRON 4 MG PO TBDP: 4.0000 mg | ORAL_TABLET | Freq: Three times a day (TID) | ORAL | 0 refills | Status: DC | PRN

## 2020-06-24 MED ORDER — ONDANSETRON 4 MG PO TBDP
4.0000 mg | ORAL_TABLET | Freq: Once | ORAL | Status: AC
  Administered 2020-06-24: 4 mg via ORAL
  Filled 2020-06-24: qty 1

## 2020-06-24 NOTE — ED Triage Notes (Addendum)
Pt coming to the ER for fever and generalized body aches for 3 days. Pt states having all 3 immunizations for covid. Pt states headache to the right side as well. Tylenol last given at 12:30 this afternoon.

## 2020-06-24 NOTE — Discharge Instructions (Addendum)
You are being treated for symptoms related to your flu diagnosis. Take OTC Tylenol and Motrin for fevers and bodyaches. Drink plenty of fluids. Take the prescription meds as needed. Return if necessary.

## 2020-06-24 NOTE — ED Provider Notes (Signed)
[facility] Emergency Department Provider Note ____________________________________________  Time seen: 1624  I have reviewed the triage vital signs and the nursing notes.  HISTORY  Chief Complaint  Fever and Generalized Body Aches  HPI Carmen Francis is a 35 y.o. female Presents herself to the ED for evaluation of 3 days of fevers and generalized body aches.  Patient has also experienced malaise, loss of appetite, and fatigue.  She denies any sick contacts, high risk exposures, or bad food exposure.  She does report being fully vaccinated against COVID including a booster vaccine.   Past Medical History:  Diagnosis Date  . Abdominal pain    Right lower  . Anxiety   . Hyperglycemia     There are no problems to display for this patient.   Past Surgical History:  Procedure Laterality Date  . BREAST SURGERY    . CESAREAN SECTION    . CHOLECYSTECTOMY    . LAPAROSCOPY N/A 11/15/2018   Procedure: LAPAROSCOPY operative, left salpingectomy;  Surgeon: Christeen Douglas, MD;  Location: [facility] ORS;  Service: Gynecology;  Laterality: N/A;  . LYSIS OF ADHESION  11/15/2018   Procedure: LYSIS OF ADHESION;  Surgeon: Christeen Douglas, MD;  Location: [facility] ORS;  Service: Gynecology;;  . TONSILLECTOMY      Prior to Admission medications   Medication Sig Start Date End Date Taking? Authorizing Provider  albuterol (VENTOLIN HFA) 108 (90 Base) MCG/ACT inhaler Inhale 2 puffs into the lungs every 6 (six) hours as needed for shortness of breath. 06/24/20  Yes Sindhu Nguyen, Charlesetta Ivory, PA-C  benzonatate (TESSALON PERLES) 100 MG capsule Take 1-2 tabs TID prn cough 06/24/20  Yes Charletta Voight V Bacon, PA-C  ondansetron (ZOFRAN ODT) 4 MG disintegrating tablet Take 1 tablet (4 mg total) by mouth every 8 (eight) hours as needed. 06/24/20  Yes Jilliam Bellmore, Charlesetta Ivory, PA-C  docusate sodium (COLACE) 100 MG capsule Take 1 capsule (100 mg total) by mouth 2 (two) times daily. To keep stools  soft 11/15/18   Christeen Douglas, MD  gabapentin (NEURONTIN) 800 MG tablet Take 1 tablet (800 mg total) by mouth at bedtime for 14 days. Take nightly for 3 days, then up to 14 days as needed 11/15/18 11/29/18  Christeen Douglas, MD    Allergies Patient has no known allergies.  History reviewed. No pertinent family history.  Social History Social History   Tobacco Use  . Smoking status: Never Smoker  . Smokeless tobacco: Never Used  Vaping Use  . Vaping Use: Never used  Substance Use Topics  . Alcohol use: Not Currently  . Drug use: Never    Review of Systems  Constitutional: Positive for fever. Eyes: Negative for visual changes. ENT: Negative for sore throat. Cardiovascular: Negative for chest pain. Respiratory: Negative for shortness of breath. Gastrointestinal: Negative for abdominal pain, vomiting and diarrhea. Genitourinary: Negative for dysuria. Musculoskeletal: Negative for back pain.  Reports generalized body aches. Skin: Negative for rash. Neurological: Negative for headaches, focal weakness or numbness. ____________________________________________  PHYSICAL EXAM:  VITAL SIGNS: ED Triage Vitals  Enc Vitals Group     BP 06/24/20 1515 134/68     Pulse Rate 06/24/20 1515 80     Resp 06/24/20 1515 (!) 22     Temp 06/24/20 1515 98.6 F (37 C)     Temp src --      SpO2 06/24/20 1515 98 %     Weight --      Height 06/24/20 1514 5\' 2"  (1.575 m)  Head Circumference --      Peak Flow --      Pain Score 06/24/20 1514 8     Pain Loc --      Pain Edu? --      Excl. in GC? --     Constitutional: Alert and oriented. Well appearing and in no distress. Head: Normocephalic and atraumatic. Eyes: Conjunctivae are normal. PERRL.  Nose: No congestion/rhinorrhea/epistaxis. Mouth/Throat: Mucous membranes are moist. Neck: Supple. No thyromegaly. Hematological/Lymphatic/Immunological: No cervical lymphadenopathy. Cardiovascular: Normal rate, regular rhythm. Normal distal  pulses. Respiratory: Normal respiratory effort. No wheezes/rales/rhonchi. Gastrointestinal: Soft and nontender. No distention. Musculoskeletal: Nontender with normal range of motion in all extremities.  Neurologic:  Normal gait without ataxia. Normal speech and language. No gross focal neurologic deficits are appreciated. Skin:  Skin is warm, dry and intact. No rash noted. Psychiatric: Mood and affect are normal. Patient exhibits appropriate insight and judgment. ____________________________________________   LABS (pertinent positives/negatives) Labs Reviewed  RESP PANEL BY RT-PCR (FLU A&B, COVID) ARPGX2 - Abnormal; Notable for the following components:      Result Value   Influenza A by PCR POSITIVE (*)    All other components within normal limits  ____________________________________________   RADIOLOGY  CXR    IMPRESSION: No active disease. ____________________________________________  PROCEDURES  Zofran 4 mg ODT  Procedures ____________________________________________  INITIAL IMPRESSION / ASSESSMENT AND PLAN / ED COURSE  DDX: Covid, Influenza, CAP, bronchitis, viral URI, AGE  Patient ED evaluation of symptoms including T, malaise, and fevers.  Patient has also had some intermittent nausea and vomiting.  Clinically the patient appears stable but uncomfortable.  Viral panel screen was positive for influenza B.  Chest x-ray is negative and reassuring for any acute intrathoracic process.  Patient will be treated for symptoms with prescriptions for Tessalon Perles, albuterol, and Zofran.  A work note was provided for her benefit.  She will follow with primary provider return to the ED if needed.   Carmen Francis was evaluated in Emergency Department on 06/24/2020 for the symptoms described in the history of present illness. She was evaluated in the context of the global COVID-19 pandemic, which necessitated consideration that the patient might be at risk for infection with  the SARS-CoV-2 virus that causes COVID-19. Institutional protocols and algorithms that pertain to the evaluation of patients at risk for COVID-19 are in a state of rapid change based on information released by regulatory bodies including the CDC and federal and state organizations. These policies and algorithms were followed during the patient's care in the ED. ____________________________________________  FINAL CLINICAL IMPRESSION(S) / ED DIAGNOSES  Final diagnoses:  Influenza      Karmen Stabs, Charlesetta Ivory, PA-C 06/24/20 2103    Phineas Semen, MD 06/24/20 2125

## 2020-09-21 ENCOUNTER — Emergency Department
Admission: EM | Admit: 2020-09-21 | Discharge: 2020-09-21 | Disposition: A | Discharge status: Home / Self Care | Payer: Self-pay | Attending: Emergency Medicine | Admitting: Emergency Medicine

## 2020-09-21 ENCOUNTER — Other Ambulatory Visit: Payer: Self-pay

## 2020-09-21 ENCOUNTER — Emergency Department: Payer: Self-pay

## 2020-09-21 DIAGNOSIS — E119 Type 2 diabetes mellitus without complications: Secondary | ICD-10-CM | POA: Insufficient documentation

## 2020-09-21 DIAGNOSIS — R42 Dizziness and giddiness: Secondary | ICD-10-CM | POA: Insufficient documentation

## 2020-09-21 DIAGNOSIS — R55 Syncope and collapse: Secondary | ICD-10-CM | POA: Insufficient documentation

## 2020-09-21 DIAGNOSIS — H538 Other visual disturbances: Secondary | ICD-10-CM | POA: Insufficient documentation

## 2020-09-21 HISTORY — DX: Type 2 diabetes mellitus without complications: E11.9

## 2020-09-21 LAB — URINALYSIS, COMPLETE (UACMP) WITH MICROSCOPIC
Bilirubin Urine: NEGATIVE
Glucose, UA: NEGATIVE mg/dL
Hgb urine dipstick: NEGATIVE
Ketones, ur: NEGATIVE mg/dL
Leukocytes,Ua: NEGATIVE
Nitrite: NEGATIVE
Protein, ur: NEGATIVE mg/dL
Specific Gravity, Urine: 1.017 (ref 1.005–1.030)
pH: 7 (ref 5.0–8.0)

## 2020-09-21 LAB — BASIC METABOLIC PANEL
Anion gap: 5 (ref 5–15)
BUN: 8 mg/dL (ref 6–20)
CO2: 22 mmol/L (ref 22–32)
Calcium: 9 mg/dL (ref 8.9–10.3)
Chloride: 107 mmol/L (ref 98–111)
Creatinine, Ser: 0.49 mg/dL (ref 0.44–1.00)
GFR, Estimated: >60 mL/min (ref >60)
Glucose, Bld: 90 mg/dL (ref 70–99)
Potassium: 4.2 mmol/L (ref 3.5–5.1)
Sodium: 134 mmol/L — ABNORMAL LOW (ref 135–145)

## 2020-09-21 LAB — CBC
HCT: 40.1 % (ref 36.0–46.0)
Hemoglobin: 13.2 g/dL (ref 12.0–15.0)
MCH: 27.2 pg (ref 26.0–34.0)
MCHC: 32.9 g/dL (ref 30.0–36.0)
MCV: 82.7 fL (ref 80.0–100.0)
Platelets: 334 10*3/uL (ref 150–400)
RBC: 4.85 MIL/uL (ref 3.87–5.11)
RDW: 14.1 % (ref 11.5–15.5)
WBC: 5.1 10*3/uL (ref 4.0–10.5)
nRBC: 0 % (ref 0.0–0.2)

## 2020-09-21 LAB — POC URINE PREG, ED: Preg Test, Ur: NEGATIVE

## 2020-09-21 MED ORDER — ACETAMINOPHEN 500 MG PO TABS
1000.0000 mg | ORAL_TABLET | Freq: Once | ORAL | Status: AC
  Administered 2020-09-21: 1000 mg via ORAL
  Filled 2020-09-21: qty 2

## 2020-09-21 MED ORDER — PROCHLORPERAZINE EDISYLATE 10 MG/2ML IJ SOLN
10.0000 mg | Freq: Once | INTRAMUSCULAR | Status: AC
  Administered 2020-09-21: 10 mg via INTRAVENOUS
  Filled 2020-09-21: qty 2

## 2020-09-21 NOTE — ED Triage Notes (Signed)
Pt c/o intermittently since last Sunday, has been having N/V in the morning when she wakes up for the past month, states she had a syncopal episode last Sunday. Pt is a/ox4

## 2020-09-21 NOTE — ED Notes (Signed)
Pt requesting to leave. Explained plan to complete MRI soon but that MRI staff delayed momentarily as they arrange for an interpreter to help with screening questions.

## 2020-09-21 NOTE — ED Notes (Signed)
Pt assisted to and from restroom using wheelchair.  

## 2020-09-21 NOTE — Discharge Instructions (Addendum)
Use Tylenol for pain and fevers.  Up to 1000 mg per dose, up to 4 times per day.  Do not take more than 4000 mg of Tylenol/acetaminophen within 24 hours..  Use naproxen/Aleve for anti-inflammatory pain relief. Use up to 500mg  every 12 hours. Do not take more frequently than this. Do not use other NSAIDs (ibuprofen, Advil) while taking this medication. It is safe to take Tylenol with this.   Return to the ED with worsening symptoms

## 2020-09-21 NOTE — ED Notes (Signed)
Pt back to [facility]. Pt steady when switching from wheelchair to stretcher. Pt calm, resp reg/unlabored, skin dry.

## 2020-09-21 NOTE — ED Notes (Addendum)
See triage note. Pt reports headache and dizziness since Sunday. Reports fall on Sunday, unsure what caused. Denies loc or hitting head. Reports hx of headache but has not been seen by DR. Has not taken any meds for h/a Pt alert and oriented. NAD noted  Pt reports decreased sensation on right side and visual changes

## 2020-09-21 NOTE — ED Provider Notes (Signed)
[facility] Provider Note ____________________________________________   Event Date/Time   First MD Initiated Contact with Patient 09/21/20 1030     (approximate)  I have reviewed the triage vital signs and the nursing notes.  HISTORY  Chief Complaint Dizziness, Headache, and Loss of Consciousness   HPI Carmen Francis is a 35 y.o. femalewho presents to the ED for evaluation of dizziness and syncope.   Chart review indicates no relevant hx. morbid obesity.  No medications, including OCPs.  Patient presents to the ED for evaluation of 2-3 days of dizziness, headache, blurry vision and left-sided sensorium changes.  She reports an episode of presyncopal dizziness upon standing 6 days ago at her workplace without syncope, headache or significant events otherwise.  This resolved, and then she reports her presenting symptoms that been present since Sunday morning.  She reports being at home with her boyfriend, when she felt a rapid onset sensation of dizziness and headache, followed by an episode of syncope.  She reports possibly striking her left-sided temporal head on a washing machine nearby.  Since that time, she reports aching left-sided headache, blurry vision to her left-sided visual fields, dizziness sensation and paresthesias to her left-sided body.  Denies additional syncopal episodes or falls.  Denies changes to her gait.  Denies fevers or recent illnesses.  History and physical obtained with the assistance of Spanish interpreter  Past Medical History:  Diagnosis Date   Abdominal pain    Right lower   Anxiety    Diabetes mellitus without complication (HCC)    Hyperglycemia     There are no problems to display for this patient.   Past Surgical History:  Procedure Laterality Date   BREAST SURGERY     CESAREAN SECTION     CHOLECYSTECTOMY     LAPAROSCOPY N/A 11/15/2018   Procedure: LAPAROSCOPY operative, left  salpingectomy;  Surgeon: Christeen Douglas, MD;  Location: [facility] OR[facility];  Service: Gynecology;  Laterality: N/A;   LYSIS OF ADHESION  11/15/2018   Procedure: LYSIS OF ADHESION;  Surgeon: Christeen Douglas, MD;  Location: [facility];  Service: Gynecology;;   TONSILLECTOMY      Prior to Admission medications   Medication Sig Start Date End Date Taking? Authorizing Provider  albuterol (VENTOLIN HFA) 108 (90 Base) MCG/ACT inhaler Inhale 2 puffs into the lungs every 6 (six) hours as needed for shortness of breath. 06/24/20   Menshew, Charlesetta Ivory, PA-C  benzonatate (TESSALON PERLES) 100 MG capsule Take 1-2 tabs TID prn cough 06/24/20   Menshew, Charlesetta Ivory, PA-C  docusate sodium (COLACE) 100 MG capsule Take 1 capsule (100 mg total) by mouth 2 (two) times daily. To keep stools soft 11/15/18   Christeen Douglas, MD  gabapentin (NEURONTIN) 800 MG tablet Take 1 tablet (800 mg total) by mouth at bedtime for 14 days. Take nightly for 3 days, then up to 14 days as needed 11/15/18 11/29/18  Christeen Douglas, MD  ondansetron (ZOFRAN ODT) 4 MG disintegrating tablet Take 1 tablet (4 mg total) by mouth every 8 (eight) hours as needed. 06/24/20   Menshew, Charlesetta Ivory, PA-C    Allergies Patient has no known allergies.  No family history on file.  Social History Social History   Tobacco Use   Smoking status: Never   Smokeless tobacco: Never  Vaping Use   Vaping Use: Never used  Substance Use Topics   Alcohol use: Not Currently   Drug use: Never    Review of Systems  Constitutional: No fever/chills Eyes: No visual changes. ENT: No sore throat. Cardiovascular: Denies chest pain. Respiratory: Denies shortness of breath. Gastrointestinal: No abdominal pain.  No nausea, no vomiting.  No diarrhea.  No constipation. Genitourinary: Negative for dysuria. Musculoskeletal: Negative for back pain. Skin: Negative for rash. Neurological: Positive for headaches, left-sided paresthesias and dizziness.  Positive for  syncopal episode.  ____________________________________________   PHYSICAL EXAM:  VITAL SIGNS: Vitals:   09/21/20 1128 09/21/20 1236  BP: 107/68 108/77  Pulse: 62 81  Resp: 18 17  Temp:    SpO2: 100% 100%    Constitutional: Alert and oriented. Well appearing and in no acute distress.  Morbidly obese.  Conversational via interpreter. Eyes: Conjunctivae are normal. PERRL. EOMI. Head: Atraumatic. Nose: No congestion/rhinnorhea. Mouth/Throat: Mucous membranes are moist.  Oropharynx non-erythematous. Neck: No stridor. No cervical spine tenderness to palpation. Cardiovascular: Normal rate, regular rhythm. Grossly normal heart sounds.  Good peripheral circulation. Respiratory: Normal respiratory effort.  No retractions. Lungs CTAB. Gastrointestinal: Soft , nondistended, nontender to palpation. No CVA tenderness. Musculoskeletal: No lower extremity tenderness nor edema.  No joint effusions. No signs of acute trauma. Neurologic:  Normal speech and language. No gait instability noted. 5/5 strength throughout bilateral upper and lower extremities. She does report sensorium changes to her left-sided extremities with paresthesias, but sensation is intact throughout. On visual field testing, she seems to have mild left-sided homonymous hemianopsia where she does not see my fingers until they are closer to midline than contralateral testing Cranial nerves otherwise intact Skin:  Skin is warm, dry and intact. No rash noted. Psychiatric: Mood and affect are normal. Speech and behavior are normal. ____________________________________________   LABS (all labs ordered are listed, but only abnormal results are displayed)  Labs Reviewed  BASIC METABOLIC PANEL - Abnormal; Notable for the following components:      Result Value   Sodium 134 (*)    All other components within normal limits  URINALYSIS, COMPLETE (UACMP) WITH MICROSCOPIC - Abnormal; Notable for the following components:   Color,  Urine YELLOW (*)    APPearance CLEAR (*)    Bacteria, UA RARE (*)    All other components within normal limits  CBC  POC URINE PREG, ED  CBG MONITORING, ED   ____________________________________________  12 Lead EKG  Sinus rhythm, rate of 71 bpm.  Normal axis and intervals.  No evidence of acute ischemia. ____________________________________________  RADIOLOGY  ED MD interpretation: CT head reviewed by me without evidence of acute intracranial pathology.  Official radiology report(s): CT Head Wo Contrast  Result Date: 09/21/2020 CLINICAL DATA:  Vertigo, left-sided paresthesias, headache and blurry vision. Evaluate signs of ICH, CVA, venous thrombosis. Nausea/vomiting this morning. Syncopal episode last Sunday. EXAM: CT HEAD WITHOUT CONTRAST TECHNIQUE: Contiguous axial images were obtained from the base of the skull through the vertex without intravenous contrast. COMPARISON:  No pertinent prior exams available for comparison. FINDINGS: Brain: Cerebral volume is normal. There is no acute intracranial hemorrhage. No demarcated cortical infarct. No extra-axial fluid collection. No evidence of intracranial mass. No midline shift. Vascular: No hyperdense vessel. Skull: Normal. Negative for fracture or focal lesion. Sinuses/Orbits: Visualized orbits show no acute finding. Mild left greater than right ethmoid sinus mucosal thickening. IMPRESSION: No evidence of acute intracranial abnormality. Mild left greater than right ethmoid sinus mucosal thickening. Electronically Signed   By: Jackey Loge DO   On: 09/21/2020 11:42    ____________________________________________   PROCEDURES and INTERVENTIONS  Procedure(s) performed (including Critical Care):  Procedures  Medications  prochlorperazine (COMPAZINE) injection 10 mg (10 mg Intravenous Given 09/21/20 1122)  acetaminophen (TYLENOL) tablet 1,000 mg (1,000 mg Oral Given 09/21/20 1122)    ____________________________________________   MDM  / ED COURSE   Obese 35 year old woman presents to the ED with neurologic symptoms and headache, possibly due to complex migraine, ultimately she leaves AMA prior to MRI to evaluate for sinus venous thrombosis.  Normal vitals.  Exam with paresthesias to her left-sided extremities and possibly a mild left-sided homonymous hemianopsia, otherwise no neurologic deficits.  Blood work and EKG are benign.  CT head demonstrates no evidence of ICH or CVA.  With plan to perform MRV to assess for ischemic features and sinus venous thrombosis, but she has resolution of symptoms after Compazine and Tylenol and demands to leave AMA.  Return precautions for the ED were discussed.  Clinical Course as of 09/21/20 1252  Tue Sep 21, 2020  1251 Patient requesting to leave AMA.  I go reevaluate the patient she reports feeling much better after Tylenol and Compazine.  We discussed my recommendation for MRI brain to assess for venous sinus stenosis versus benefits pathology, but she reports that she feels fine and just wants to go home.  We discussed the possibility of missed pathology, she expressed understanding.  Will discharge AMA. [DS]  1252 Her left-sided paresthesias have resolved and she reports all of her sensations and symptoms have resolved and she feels at baseline. [DS]    Clinical Course User Index [DS] Delton Prairie, MD    ____________________________________________   FINAL CLINICAL IMPRESSION(S) / ED DIAGNOSES  Final diagnoses:  Dizziness     ED Discharge Orders     None        Mali Eppard Katrinka Blazing   Note:  This document was prepared using Dragon voice recognition software and may include unintentional dictation errors.    Delton Prairie, MD 09/21/20 1254

## 2020-09-21 NOTE — ED Notes (Signed)
Pt to CT

## 2020-09-21 NOTE — ED Notes (Signed)
Carmen Francis notified in person pt requesting to leave AMA. Carmen talked with pt. Pt leaving AMA.

## 2020-09-21 NOTE — ED Notes (Signed)
Pt signed printed AMA form. IV removed.

## 2021-04-18 ENCOUNTER — Emergency Department
Admission: EM | Admit: 2021-04-18 | Discharge: 2021-04-18 | Disposition: A | Discharge status: Home / Self Care | Payer: Self-pay | Attending: Emergency Medicine | Admitting: Emergency Medicine

## 2021-04-18 ENCOUNTER — Emergency Department: Payer: Self-pay

## 2021-04-18 ENCOUNTER — Other Ambulatory Visit: Payer: Self-pay

## 2021-04-18 DIAGNOSIS — R112 Nausea with vomiting, unspecified: Secondary | ICD-10-CM | POA: Insufficient documentation

## 2021-04-18 DIAGNOSIS — R197 Diarrhea, unspecified: Secondary | ICD-10-CM | POA: Insufficient documentation

## 2021-04-18 DIAGNOSIS — R1031 Right lower quadrant pain: Secondary | ICD-10-CM | POA: Insufficient documentation

## 2021-04-18 LAB — COMPREHENSIVE METABOLIC PANEL
ALT: 14 U/L (ref 0–44)
AST: 20 U/L (ref 15–41)
Albumin: 3.9 g/dL (ref 3.5–5.0)
Alkaline Phosphatase: 68 U/L (ref 38–126)
Anion gap: 5 (ref 5–15)
BUN: 9 mg/dL (ref 6–20)
CO2: 26 mmol/L (ref 22–32)
Calcium: 9 mg/dL (ref 8.9–10.3)
Chloride: 105 mmol/L (ref 98–111)
Creatinine, Ser: 0.53 mg/dL (ref 0.44–1.00)
GFR, Estimated: >60 mL/min (ref >60)
Glucose, Bld: 102 mg/dL — ABNORMAL HIGH (ref 70–99)
Potassium: 4 mmol/L (ref 3.5–5.1)
Sodium: 136 mmol/L (ref 135–145)
Total Bilirubin: 0.4 mg/dL (ref 0.3–1.2)
Total Protein: 7.2 g/dL (ref 6.5–8.1)

## 2021-04-18 LAB — URINALYSIS, ROUTINE W REFLEX MICROSCOPIC
Bilirubin Urine: NEGATIVE
Glucose, UA: NEGATIVE mg/dL
Hgb urine dipstick: NEGATIVE
Ketones, ur: NEGATIVE mg/dL
Leukocytes,Ua: NEGATIVE
Nitrite: NEGATIVE
Protein, ur: NEGATIVE mg/dL
Specific Gravity, Urine: >1.03 — ABNORMAL HIGH (ref 1.005–1.030)
pH: 6.5 (ref 5.0–8.0)

## 2021-04-18 LAB — CBC
HCT: 37.8 % (ref 36.0–46.0)
Hemoglobin: 12.4 g/dL (ref 12.0–15.0)
MCH: 28.6 pg (ref 26.0–34.0)
MCHC: 32.8 g/dL (ref 30.0–36.0)
MCV: 87.3 fL (ref 80.0–100.0)
Platelets: 369 10*3/uL (ref 150–400)
RBC: 4.33 MIL/uL (ref 3.87–5.11)
RDW: 13.3 % (ref 11.5–15.5)
WBC: 7.4 10*3/uL (ref 4.0–10.5)
nRBC: 0 % (ref 0.0–0.2)

## 2021-04-18 LAB — LIPASE, BLOOD: Lipase: 32 U/L (ref 11–51)

## 2021-04-18 LAB — POC URINE PREG, ED: Preg Test, Ur: NEGATIVE

## 2021-04-18 MED ORDER — ONDANSETRON 4 MG PO TBDP: 4.0000 mg | ORAL_TABLET | Freq: Three times a day (TID) | ORAL | 0 refills | Status: DC | PRN

## 2021-04-18 MED ORDER — IOHEXOL 300 MG/ML  SOLN
100.0000 mL | Freq: Once | INTRAMUSCULAR | Status: AC | PRN
  Administered 2021-04-18: 100 mL via INTRAVENOUS

## 2021-04-18 NOTE — ED Provider Notes (Signed)
[facility] Provider Note    Event Date/Time   First MD Initiated Contact with Patient 04/18/21 1626     (approximate)   History   Abdominal Pain   HPI  Carmen Francis is a 36 y.o. female who comes in complaining of right lower quadrant pain and right-sided abdominal pain this is been going on intermittently for 2 years or more.  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  Patient reports nausea and vomiting in the morning and some episodes of diarrhea as well.  This is been going on for at least several weeks.  She had an ultrasound showing a fibroid uterus at [facility] because of lower abdominal pain.  I discussed her with Dr. Mia Creek.  She has an appointment with GI at [facility] at the end of February but that will be almost 2 months from now.  Dr. Mia Creek said he will be able to get her in sooner here as she and her husband want.  Dr. Mia Creek asked for a repeat CT since last 1 was from 2 years ago.  This was done and does not show any significant change.      Physical Exam   Triage Vital Signs: ED Triage Vitals [04/18/21 1138]  Enc Vitals Group     BP 135/83     Pulse Rate (!) 57     Resp 18     Temp 98.1 F (36.7 C)     Temp Source Oral     SpO2 100 %     Weight 196 lb (88.9 kg)     Height 5\' 2"  (1.575 m)     Head Circumference      Peak Flow      Pain Score      Pain Loc      Pain Edu?      Excl. in GC?     Most recent vital signs: Vitals:   04/18/21 1700 04/18/21 1804  BP: 139/82 111/74  Pulse: 61 (!) 54  Resp: 18 16  Temp:    SpO2: 100% 100%   General: Awake, no distress.  CV:  Good peripheral perfusion.  Heart regular rate and rhythm no audible murmur Resp:  Normal effort.  Lungs are clear Abd:  No distention.  She does have some right lower quadrant pain and a little bit of pain over towards the suprapubic area. Extremities: No tenderness or edema.   ED Results /  Procedures / Treatments   Labs (all labs ordered are listed, but only abnormal results are displayed) Labs Reviewed  COMPREHENSIVE METABOLIC PANEL - Abnormal; Notable for the following components:      Result Value   Glucose, Bld 102 (*)    All other components within normal limits  URINALYSIS, ROUTINE W REFLEX MICROSCOPIC - Abnormal; Notable for the following components:   Specific Gravity, Urine >1.030 (*)    All other components within normal limits  LIPASE, BLOOD  CBC  POC URINE PREG, ED     EKG     RADIOLOGY CT read by radiology reviewed by me shows no change from prior.   PROCEDURES:  Critical Care performed:   Procedures   MEDICATIONS ORDERED IN ED: Medications  iohexol (OMNIPAQUE) 300 MG/ML solution 100 mL (100 mLs Intravenous Contrast Given 04/18/21 1741)     IMPRESSION / MDM / ASSESSMENT AND PLAN / ED COURSE  I reviewed the triage vital signs  and the nursing notes.                             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  Pain and symptoms are really unchanged for 2 years.  At the request of GI Dr. Mia Creek I obtained another CT which shows no significant change from the previous one.  Patient's lab work urinalysis pregnancy test white blood count etc. are essentially normal.  There is no sign of any appendicitis or any other intra-abdominal process. I have course reviewed all of the lab studies and CT.  I discussed her in detail with Dr. Mia Creek.  Dr. Mia Creek will be able to see her quite a bit sooner than the end of next month.  Patient will return here if she has any worsening.        FINAL CLINICAL IMPRESSION(S) / ED DIAGNOSES   Final diagnoses:  Right lower quadrant abdominal pain     Rx / DC Orders   ED Discharge Orders     None        Note:  This document was prepared using Dragon voice recognition software and may include unintentional dictation errors.   Arnaldo Natal, MD 04/18/21 [phone]

## 2021-04-18 NOTE — ED Triage Notes (Signed)
Pt c/o left sided abd pain with N/V/D for the past couple of days

## 2021-04-18 NOTE — Discharge Instructions (Addendum)
Your CT has not changed.  This is a very good thing.  Please follow-up with Dr. Mia Creek.  His office should call you in the morning.  If he does not you can call his office later in the afternoon about 2:00.  I have included his contact information.  I have discussed you with him and he knows that you have some terminal ileitis on the CT scan.  He should be able to access your labs and CT scan from today on the computer in his office.  Please return for any further problems or worsening pain fever etc.  Can take Zofran melt on your tongue wafers 1 3 times a day if needed for nausea.

## 2022-02-13 ENCOUNTER — Emergency Department
Admission: EM | Admit: 2022-02-13 | Discharge: 2022-02-13 | Disposition: A | Discharge status: Home / Self Care | Payer: Self-pay | Attending: Emergency Medicine | Admitting: Emergency Medicine

## 2022-02-13 ENCOUNTER — Other Ambulatory Visit: Payer: Self-pay

## 2022-02-13 ENCOUNTER — Emergency Department: Payer: Medicaid Other

## 2022-02-13 DIAGNOSIS — R1084 Generalized abdominal pain: Secondary | ICD-10-CM

## 2022-02-13 DIAGNOSIS — E119 Type 2 diabetes mellitus without complications: Secondary | ICD-10-CM | POA: Insufficient documentation

## 2022-02-13 DIAGNOSIS — Z1152 Encounter for screening for COVID-19: Secondary | ICD-10-CM | POA: Insufficient documentation

## 2022-02-13 DIAGNOSIS — A084 Viral intestinal infection, unspecified: Secondary | ICD-10-CM | POA: Insufficient documentation

## 2022-02-13 LAB — URINALYSIS, ROUTINE W REFLEX MICROSCOPIC
Bilirubin Urine: NEGATIVE
Glucose, UA: NEGATIVE mg/dL
Hgb urine dipstick: NEGATIVE
Ketones, ur: NEGATIVE mg/dL
Leukocytes,Ua: NEGATIVE
Nitrite: NEGATIVE
Protein, ur: NEGATIVE mg/dL
Specific Gravity, Urine: 1.02 (ref 1.005–1.030)
pH: 8 (ref 5.0–8.0)

## 2022-02-13 LAB — COMPREHENSIVE METABOLIC PANEL
ALT: 15 U/L (ref 0–44)
AST: 25 U/L (ref 15–41)
Albumin: 4 g/dL (ref 3.5–5.0)
Alkaline Phosphatase: 67 U/L (ref 38–126)
Anion gap: 7 (ref 5–15)
BUN: 10 mg/dL (ref 6–20)
CO2: 21 mmol/L — ABNORMAL LOW (ref 22–32)
Calcium: 9 mg/dL (ref 8.9–10.3)
Chloride: 110 mmol/L (ref 98–111)
Creatinine, Ser: 0.58 mg/dL (ref 0.44–1.00)
GFR, Estimated: >60 mL/min (ref >60)
Glucose, Bld: 96 mg/dL (ref 70–99)
Potassium: 4.1 mmol/L (ref 3.5–5.1)
Sodium: 138 mmol/L (ref 135–145)
Total Bilirubin: 0.5 mg/dL (ref 0.3–1.2)
Total Protein: 7.5 g/dL (ref 6.5–8.1)

## 2022-02-13 LAB — CBC
HCT: 38 % (ref 36.0–46.0)
Hemoglobin: 12.2 g/dL (ref 12.0–15.0)
MCH: 26.7 pg (ref 26.0–34.0)
MCHC: 32.1 g/dL (ref 30.0–36.0)
MCV: 83.2 fL (ref 80.0–100.0)
Platelets: 355 10*3/uL (ref 150–400)
RBC: 4.57 MIL/uL (ref 3.87–5.11)
RDW: 14.1 % (ref 11.5–15.5)
WBC: 9.7 10*3/uL (ref 4.0–10.5)
nRBC: 0 % (ref 0.0–0.2)

## 2022-02-13 LAB — RESP PANEL BY RT-PCR (FLU A&B, COVID) ARPGX2
Influenza A by PCR: NEGATIVE
Influenza B by PCR: NEGATIVE
SARS Coronavirus 2 by RT PCR: NEGATIVE

## 2022-02-13 LAB — LIPASE, BLOOD: Lipase: 30 U/L (ref 11–51)

## 2022-02-13 LAB — POC URINE PREG, ED: Preg Test, Ur: NEGATIVE

## 2022-02-13 MED ORDER — ONDANSETRON 4 MG PO TBDP: 4.0000 mg | ORAL_TABLET | Freq: Three times a day (TID) | ORAL | 0 refills | Status: DC | PRN

## 2022-02-13 MED ORDER — KETOROLAC TROMETHAMINE 30 MG/ML IJ SOLN
15.0000 mg | Freq: Once | INTRAMUSCULAR | Status: AC
  Administered 2022-02-13: 15 mg via INTRAVENOUS
  Filled 2022-02-13: qty 1

## 2022-02-13 MED ORDER — ONDANSETRON HCL 4 MG/2ML IJ SOLN
4.0000 mg | Freq: Once | INTRAMUSCULAR | Status: AC
  Administered 2022-02-13: 4 mg via INTRAVENOUS
  Filled 2022-02-13: qty 2

## 2022-02-13 MED ORDER — LACTATED RINGERS IV BOLUS
1000.0000 mL | Freq: Once | INTRAVENOUS | Status: AC
  Administered 2022-02-13: 1000 mL via INTRAVENOUS

## 2022-02-13 NOTE — ED Triage Notes (Signed)
Patient arrives with complains of N/V/D, cough and feeling unwell. Patients daughter was sick recently. No one in the home has been tested for COVID or Flu.

## 2022-02-13 NOTE — ED Notes (Signed)
See triage note Presents with some n/v/d and body aches  States sx's started 3 days ago  Afebrile on arrival   States everyone in the house has been sick

## 2022-02-13 NOTE — ED Provider Notes (Signed)
[facility] Provider Note    Event Date/Time   First MD Initiated Contact with Patient 02/13/22 [phone]     (approximate)   History   Chief Complaint Abdominal Pain, Diarrhea, and Emesis   HPI  Carmen Francis is a 36 y.o. female with past medical history of diabetes and anxiety who presents to the ED complaining of abdominal pain.  Patient reports that for the past 2 days she has been dealing with persistent nausea, vomiting, diarrhea, and diffuse abdominal pain.  She describes the pain as crampy and present constantly, not exacerbated or alleviated by anything in particular.  She has been feeling malaised with diffuse body aches as well, has not checked her temperature at home.  She has had a productive cough with yellowish sputum, denies any pain in her chest or difficulty breathing.  She has not noticed any blood in her emesis or stool, does state it has been difficult for her to keep down either liquids or solids.  Multiple family members have been sick with similar symptoms, nobody has yet been tested for COVID or flu.     Physical Exam   Triage Vital Signs: ED Triage Vitals  Enc Vitals Group     BP 02/13/22 0702 (!) 127/54     Pulse Rate 02/13/22 0659 72     Resp 02/13/22 0659 17     Temp 02/13/22 0659 98.4 F (36.9 C)     Temp Source 02/13/22 0659 Oral     SpO2 02/13/22 0659 97 %     Weight 02/13/22 0713 195 lb 15.8 oz (88.9 kg)     Height 02/13/22 0700 5\' 3"  (1.6 m)     Head Circumference --      Peak Flow --      Pain Score 02/13/22 0700 7     Pain Loc --      Pain Edu? --      Excl. in GC? --     Most recent vital signs: Vitals:   02/13/22 0659 02/13/22 0702  BP:  (!) 127/54  Pulse: 72   Resp: 17   Temp: 98.4 F (36.9 C)   SpO2: 97%     Constitutional: Alert and oriented. Eyes: Conjunctivae are normal. Head: Atraumatic. Nose: No congestion/rhinnorhea. Mouth/Throat: Mucous membranes are moist.  Cardiovascular: Normal rate,  regular rhythm. Grossly normal heart sounds.  2+ radial pulses bilaterally. Respiratory: Normal respiratory effort.  No retractions. Lungs CTAB. Gastrointestinal: Soft and nontender. No distention. Musculoskeletal: No lower extremity tenderness nor edema.  Neurologic:  Normal speech and language. No gross focal neurologic deficits are appreciated.    ED Results / Procedures / Treatments   Labs (all labs ordered are listed, but only abnormal results are displayed) Labs Reviewed  COMPREHENSIVE METABOLIC PANEL - Abnormal; Notable for the following components:      Result Value   CO2 21 (*)    All other components within normal limits  URINALYSIS, ROUTINE W REFLEX MICROSCOPIC - Abnormal; Notable for the following components:   Color, Urine YELLOW (*)    APPearance HAZY (*)    All other components within normal limits  RESP PANEL BY RT-PCR (FLU A&B, COVID) ARPGX2  LIPASE, BLOOD  CBC  POC URINE PREG, ED   RADIOLOGY Chest x-ray reviewed and interpreted by me with no infiltrate, edema, or effusion.  PROCEDURES:  Critical Care performed: No  Procedures   MEDICATIONS ORDERED IN ED: Medications  lactated ringers bolus 1,000 mL (1,000 mLs Intravenous  New Bag/Given 02/13/22 0749)  ondansetron (ZOFRAN) injection 4 mg (4 mg Intravenous Given 02/13/22 0749)  ketorolac (TORADOL) 30 MG/ML injection 15 mg (15 mg Intravenous Given 02/13/22 0806)     IMPRESSION / MDM / ASSESSMENT AND PLAN / ED COURSE  I reviewed the triage vital signs and the nursing notes.                              35 y.o. female with past medical history of diabetes and anxiety who presents to the ED with 2 days of nausea, vomiting, diarrhea, diffuse abdominal pain, cough, and malaise.  Patient's presentation is most consistent with acute presentation with potential threat to life or bodily function.  Differential diagnosis includes, but is not limited to, COVID-19, influenza, other viral syndrome, dehydration,  electrolyte abnormality, AKI, UTI, pneumonia, bronchitis.  Patient nontoxic-appearing and in no acute distress, vital signs are unremarkable and she has a benign abdominal exam.  Symptoms seem most consistent with viral syndrome and labs are reassuring with no significant anemia, leukocytosis, electrolyte abnormality, or AKI.  LFTs and lipase are also unremarkable, urine studies are pending.  Given her inability to tolerate oral intake, we will hydrate with IV fluids and treat symptomatically with IV Zofran.  Plan to give dose of IV Toradol if pregnancy testing is negative.  Pregnancy testing is negative, urinalysis shows no signs of infection.  Chest x-ray is also unremarkable.  Patient reports feeling better following Zofran, Toradol, and IV fluids.  She is tolerating water and crackers without difficulty here in the ED and is appropriate for outpatient management of suspected viral gastroenteritis.  She will be prescribed Zofran, was counseled on over-the-counter loperamide as needed for diarrhea.  She was counseled to drink plenty of fluids and to return to the ED for new or worsening symptoms, patient agrees with plan.      FINAL CLINICAL IMPRESSION(S) / ED DIAGNOSES   Final diagnoses:  Viral gastroenteritis  Generalized abdominal pain     Rx / DC Orders   ED Discharge Orders          Ordered    ondansetron (ZOFRAN-ODT) 4 MG disintegrating tablet  Every 8 hours PRN        02/13/22 0849             Note:  This document was prepared using Dragon voice recognition software and may include unintentional dictation errors.   Chesley Noon, MD 02/13/22 [phone]

## 2022-02-19 IMAGING — CT CT HEAD W/O CM
3 series · 16 of 47 positions shown, 19 images · non-contrast
Comparison: No pertinent prior exams available for comparison.

CLINICAL DATA: Vertigo, left-sided paresthesias, headache and
blurry vision. Evaluate signs of [place], CVA, venous thrombosis.
Nausea/vomiting this morning. Syncopal episode last [REDACTED].

EXAM:
CT HEAD WITHOUT CONTRAST
TECHNIQUE: Contiguous axial images were obtained from the base of the skull
through the vertex without intravenous contrast.

[Series 2: head wo · axial · 0.45mm/px · z∈[-159,-34]mm · 10 of 31 slices shown, 13 images]
[im 3/31  brain]
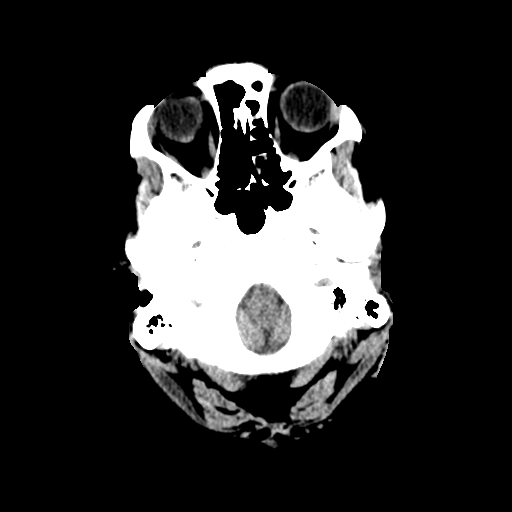
[im 3/31  bone]
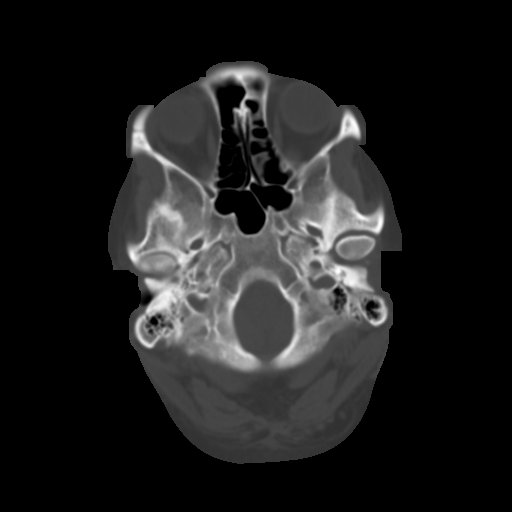
[im 6/31  brain]
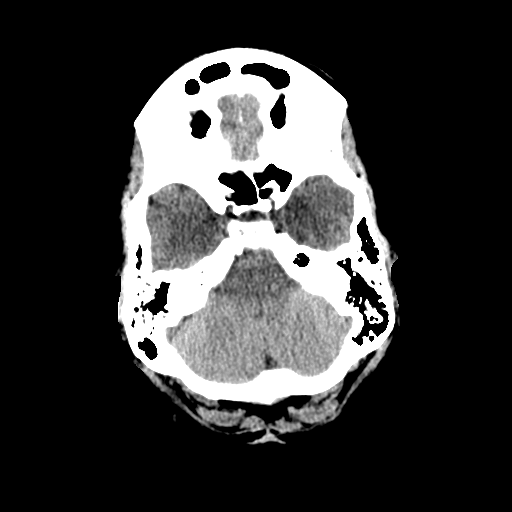
[im 9/31  brain]
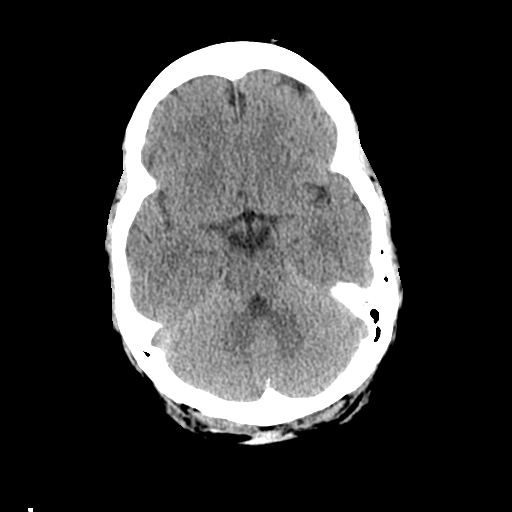
[im 11/31  brain]
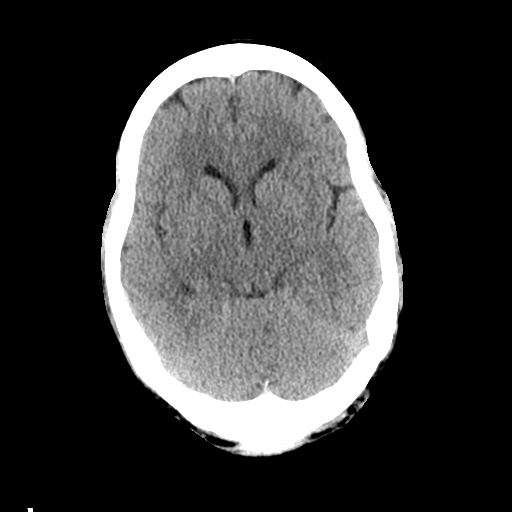
[im 14/31  brain]
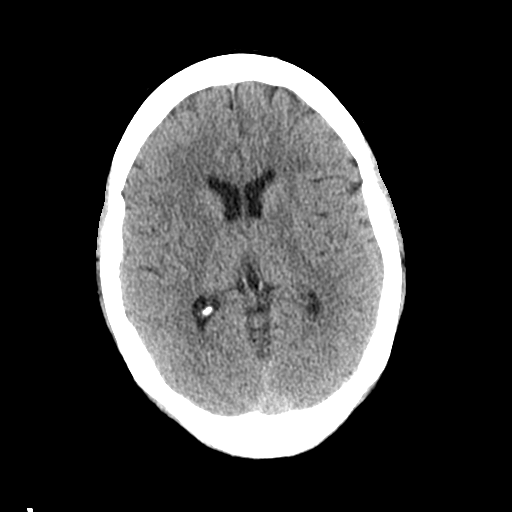
[im 14/31  bone]
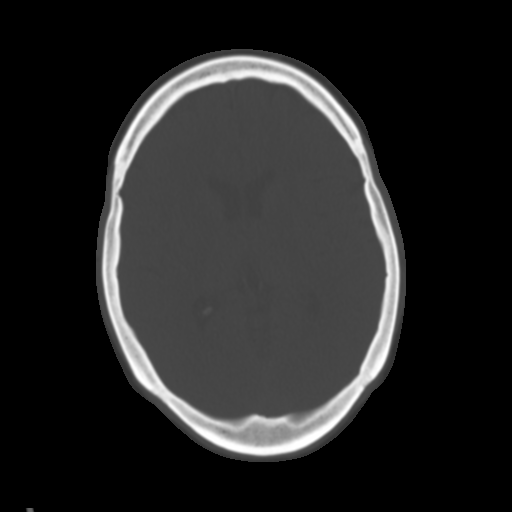
[im 17/31  brain]
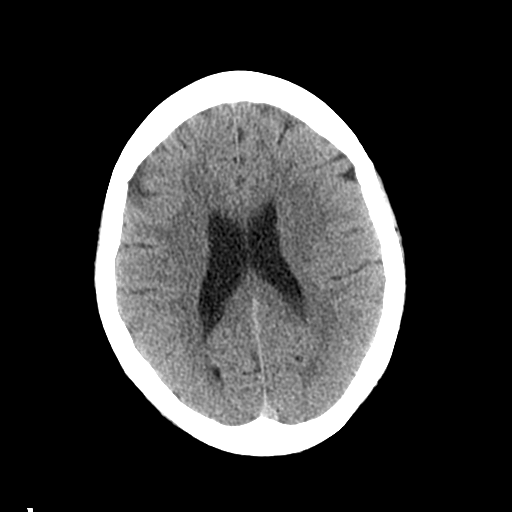
[im 20/31  brain]
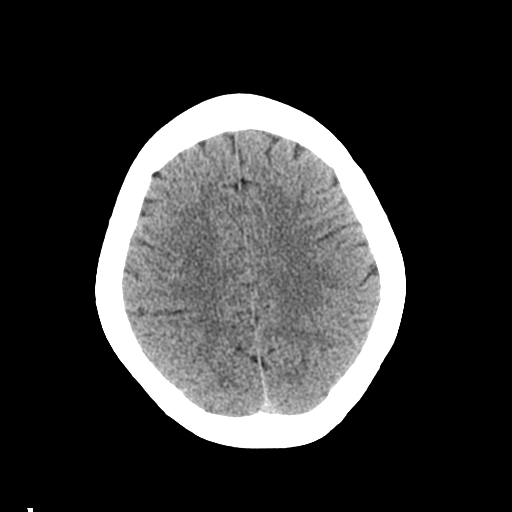
[im 23/31  brain]
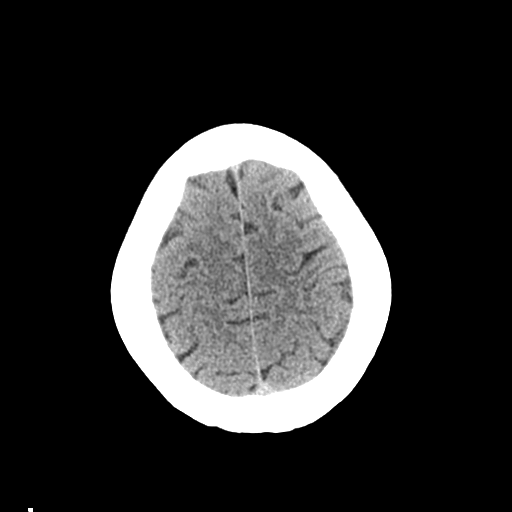
[im 25/31  brain]
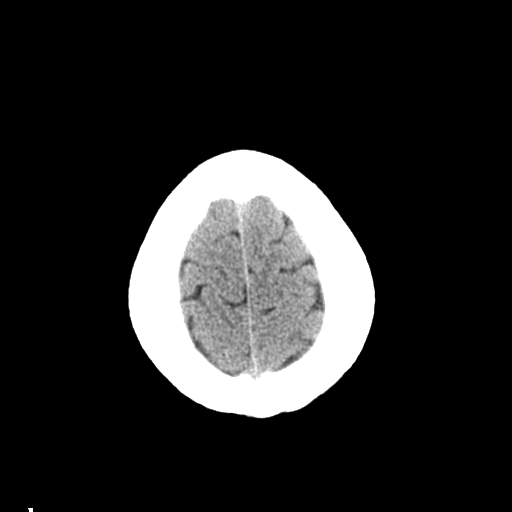
[im 25/31  bone]
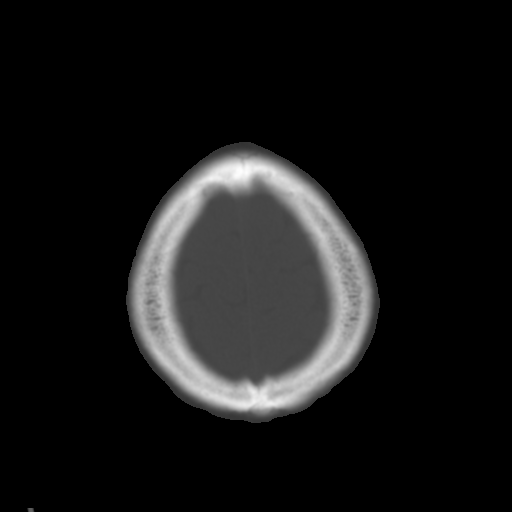
[im 28/31  brain]
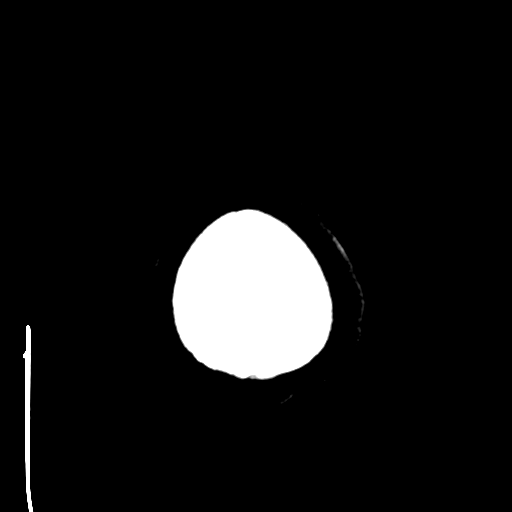

[Series 4: coronal soft tissue · coronal · 0.33mm/px · 3 of 64 slices shown]
[im 22/64  brain]
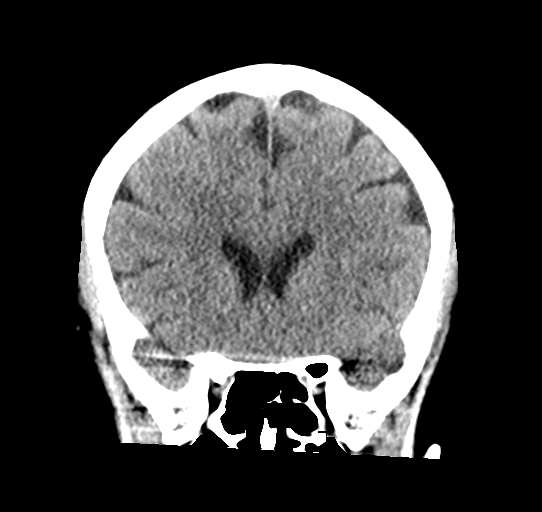
[im 29/64  brain]
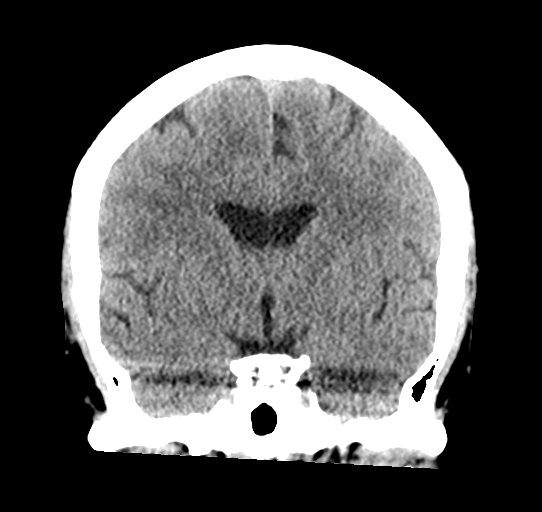
[im 36/64  brain]
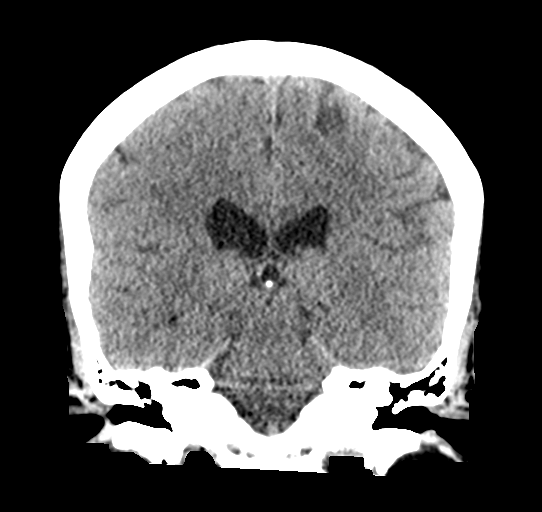

[Series 5: sagittal soft tissue · sagittal · 0.33mm/px · 3 of 53 slices shown]
[im 18/53  brain]
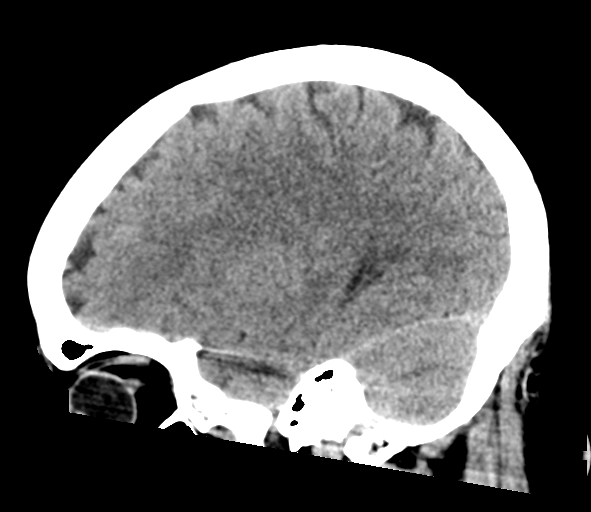
[im 27/53  brain]
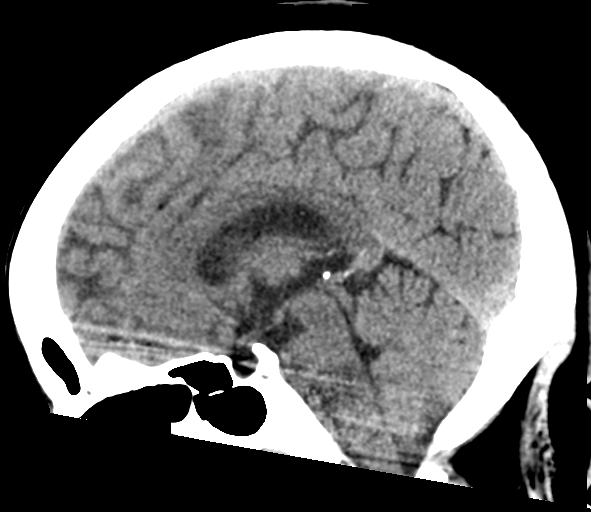
[im 35/53  brain]
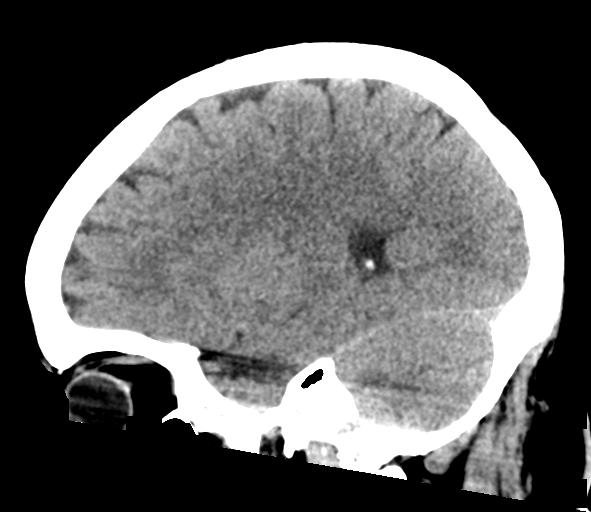

[16 of 47 positions shown; findings below may reference images not displayed]

FINDINGS: Brain:

Cerebral volume is normal.

There is no acute intracranial hemorrhage.

No demarcated cortical infarct.

No extra-axial fluid collection.

No evidence of intracranial mass.

No midline shift.

Vascular: No hyperdense vessel.

Skull: Normal. Negative for fracture or focal lesion.

Sinuses/Orbits: Visualized orbits show no acute finding. Mild left
greater than right ethmoid sinus mucosal thickening.
IMPRESSION: No evidence of acute intracranial abnormality.

Mild left greater than right ethmoid sinus mucosal thickening.

## 2022-04-08 ENCOUNTER — Emergency Department
Admission: EM | Admit: 2022-04-08 | Discharge: 2022-04-09 | Disposition: A | Discharge status: Home / Self Care | Payer: Medicaid Other | Attending: Emergency Medicine | Admitting: Emergency Medicine

## 2022-04-08 ENCOUNTER — Emergency Department: Payer: Medicaid Other

## 2022-04-08 ENCOUNTER — Encounter: Payer: Self-pay | Admitting: Emergency Medicine

## 2022-04-08 ENCOUNTER — Other Ambulatory Visit: Payer: Self-pay

## 2022-04-08 DIAGNOSIS — R42 Dizziness and giddiness: Secondary | ICD-10-CM | POA: Insufficient documentation

## 2022-04-08 DIAGNOSIS — G8929 Other chronic pain: Secondary | ICD-10-CM | POA: Insufficient documentation

## 2022-04-08 DIAGNOSIS — D75839 Thrombocytosis, unspecified: Secondary | ICD-10-CM | POA: Insufficient documentation

## 2022-04-08 DIAGNOSIS — N760 Acute vaginitis: Secondary | ICD-10-CM | POA: Insufficient documentation

## 2022-04-08 LAB — URINALYSIS, ROUTINE W REFLEX MICROSCOPIC
Bacteria, UA: NONE SEEN
Bilirubin Urine: NEGATIVE
Glucose, UA: NEGATIVE mg/dL
Hgb urine dipstick: NEGATIVE
Ketones, ur: 20 mg/dL — AB
Leukocytes,Ua: NEGATIVE
Nitrite: NEGATIVE
Protein, ur: 30 mg/dL — AB
Specific Gravity, Urine: 1.03 (ref 1.005–1.030)
pH: 6 (ref 5.0–8.0)

## 2022-04-08 LAB — COMPREHENSIVE METABOLIC PANEL
ALT: 16 U/L (ref 0–44)
AST: 23 U/L (ref 15–41)
Albumin: 4 g/dL (ref 3.5–5.0)
Alkaline Phosphatase: 72 U/L (ref 38–126)
Anion gap: 8 (ref 5–15)
BUN: 12 mg/dL (ref 6–20)
CO2: 23 mmol/L (ref 22–32)
Calcium: 8.9 mg/dL (ref 8.9–10.3)
Chloride: 106 mmol/L (ref 98–111)
Creatinine, Ser: 0.66 mg/dL (ref 0.44–1.00)
GFR, Estimated: >60 mL/min (ref >60)
Glucose, Bld: 101 mg/dL — ABNORMAL HIGH (ref 70–99)
Potassium: 3.9 mmol/L (ref 3.5–5.1)
Sodium: 137 mmol/L (ref 135–145)
Total Bilirubin: 0.5 mg/dL (ref 0.3–1.2)
Total Protein: 7.5 g/dL (ref 6.5–8.1)

## 2022-04-08 LAB — CBC
HCT: 38.3 % (ref 36.0–46.0)
Hemoglobin: 12.3 g/dL (ref 12.0–15.0)
MCH: 27 pg (ref 26.0–34.0)
MCHC: 32.1 g/dL (ref 30.0–36.0)
MCV: 84.2 fL (ref 80.0–100.0)
Platelets: 412 10*3/uL — ABNORMAL HIGH (ref 150–400)
RBC: 4.55 MIL/uL (ref 3.87–5.11)
RDW: 14.2 % (ref 11.5–15.5)
WBC: 9.4 10*3/uL (ref 4.0–10.5)
nRBC: 0 % (ref 0.0–0.2)

## 2022-04-08 LAB — POC URINE PREG, ED: Preg Test, Ur: NEGATIVE

## 2022-04-08 LAB — LIPASE, BLOOD: Lipase: 37 U/L (ref 11–51)

## 2022-04-08 MED ORDER — ONDANSETRON 4 MG PO TBDP
4.0000 mg | ORAL_TABLET | Freq: Once | ORAL | Status: AC | PRN
  Administered 2022-04-08: 4 mg via ORAL
  Filled 2022-04-08: qty 1

## 2022-04-08 NOTE — ED Triage Notes (Signed)
Pt here for right flank pain with vomiting.  Appears in pain.  VSS, no fevers.  Has also had diarrhea.  Ambulatory without difficulty.  No active vomiting but c/o nausea.  Burning with urination.  Denies hematuria.

## 2022-04-08 NOTE — ED Notes (Signed)
MD at bedside. 

## 2022-04-09 LAB — WET PREP, GENITAL
Sperm: NONE SEEN
Trich, Wet Prep: NONE SEEN
WBC, Wet Prep HPF POC: <10 (ref <10)
Yeast Wet Prep HPF POC: NONE SEEN

## 2022-04-09 LAB — CHLAMYDIA/NGC RT PCR (ARMC ONLY)
Chlamydia Tr: NOT DETECTED
N gonorrhoeae: NOT DETECTED

## 2022-04-09 MED ORDER — ONDANSETRON 4 MG PO TBDP: 4.0000 mg | ORAL_TABLET | Freq: Three times a day (TID) | ORAL | 0 refills | Status: AC | PRN

## 2022-04-09 MED ORDER — LIDOCAINE 5 % EX PTCH: 1.0000 | MEDICATED_PATCH | Freq: Two times a day (BID) | CUTANEOUS | 0 refills | Status: AC

## 2022-04-09 MED ORDER — KETOROLAC TROMETHAMINE 30 MG/ML IJ SOLN: 30.0000 mg | Freq: Once | INTRAMUSCULAR | Status: DC

## 2022-04-09 MED ORDER — METRONIDAZOLE 500 MG PO TABS: 500.0000 mg | ORAL_TABLET | Freq: Two times a day (BID) | ORAL | 0 refills | Status: AC

## 2022-04-09 NOTE — Discharge Instructions (Addendum)
Please call your GI doctor and your OB/GYN to follow-up with.  We are treating you for bacterial vaginosis with some Flagyl.  I do not drink alcohol with this.  Return to the ER if develop worsening fevers or any other concerns.  You can take Tylenol 1 g every 8 hours to help with pain  Your STD test are still pending.  Follow-up these up in [place] and leave your phone on you if results are positive they will call.

## 2022-04-09 NOTE — ED Notes (Signed)
Pt moved to private area and dressed in gown and ready for pelvic swabs.

## 2022-04-09 NOTE — ED Provider Notes (Signed)
[facility] Provider Note    Event Date/Time   First MD Initiated Contact with Patient 04/09/22 0001     (approximate)   History   Flank Pain (/)   HPI  Carmen Francis is a 37 y.o. female G6, P2 status post 2 C-sections as well as cholecystectomy who comes in with right lower quadrant pain intermittently over the past 3 years.  She reports being told she has some inflammation of her ileum in the past but has been followed up by GI and they are not sure why she continues to have the symptoms.  She was seen in the emergency room on 1/16 where she had a CT scan with contrast done to the right lower quadrant pain which shows no particular change from below.  She was discussed with Dr. Haig Prophet.  In the past she has had EGDs and colonoscopy it was suspect that the patient had IBS also underlying untreated mental health versus mild Crohn's.  Patient was told to stop NSAIDs and could also be related to known endometriosis.  Patient reports that she has not been taking her anxiety medications.  She reports that mentally she has been doing well and denies any SI.  She denies any falls or hitting her head.  She does report sometimes the pain causes her to feel a little dizzy.  She denies any vaginal discharge.  Reports being with 1 partner for 3 years.  Denies any concerns for STDs     Physical Exam   Triage Vital Signs: ED Triage Vitals [04/08/22 2028]  Enc Vitals Group     BP (!) 140/77     Pulse Rate 85     Resp (!) 22     Temp 98.2 F (36.8 C)     Temp Source Oral     SpO2 100 %     Weight 200 lb (90.7 kg)     Height 5\' 2"  (1.575 m)     Head Circumference      Peak Flow      Pain Score 10     Pain Loc      Pain Edu?      Excl. in [place]?     Most recent vital signs: Vitals:   04/08/22 2028  BP: (!) 140/77  Pulse: 85  Resp: (!) 22  Temp: 98.2 F (36.8 C)  SpO2: 100%     General: Awake, no distress.  CV:  Good peripheral perfusion.   Resp:  Normal effort.  Abd:  No distention.  Mild tenderness in the right lower quadrant without any rebound or guarding. Other:     ED Results / Procedures / Treatments   Labs (all labs ordered are listed, but only abnormal results are displayed) Labs Reviewed  COMPREHENSIVE METABOLIC PANEL - Abnormal; Notable for the following components:      Result Value   Glucose, Bld 101 (*)    All other components within normal limits  CBC - Abnormal; Notable for the following components:   Platelets 412 (*)    All other components within normal limits  URINALYSIS, ROUTINE W REFLEX MICROSCOPIC - Abnormal; Notable for the following components:   Color, Urine YELLOW (*)    APPearance HAZY (*)    Ketones, ur 20 (*)    Protein, ur 30 (*)    All other components within normal limits  LIPASE, BLOOD  POC URINE PREG, ED     EKG  My interpretation of EKG:  Sinus  bradycardia rate of 59 without any ST elevation or T wave inversions, normal intervals  RADIOLOGY I have reviewed the CT personally and interpreted no kidney stone  IMPRESSION: No acute abnormality in the abdomen or pelvis.  I did call the radiologist to confirm that patient does not have appendicitis and this was confirmed.  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    PROCEDURES:  Critical Care performed: No  Procedures   MEDICATIONS ORDERED IN ED: Medications  ondansetron (ZOFRAN-ODT) disintegrating tablet 4 mg (4 mg Oral Given 04/08/22 2033)     IMPRESSION / MDM / ASSESSMENT AND PLAN / ED COURSE  I reviewed the triage vital signs and the nursing notes.   Patient's presentation is most consistent with acute presentation with potential threat to life or bodily function.   Differential includes chronic abdominal pain from endometriosis, IBS, mild Crohn's disease.  Seems like chronic process given this going off and on for many years.  Patient is post  avoid NSAIDs.  Will discuss Tylenol usage given she is only doing 1 g daily as well as Zofran to help with symptoms and to follow-up with GI.  Pregnancy test was negative lipase was normal CMP was normal CBC shows slightly elevated platelets urine with some slight ketones in it  We did do a pelvic exam given she has not had any recent STD testing but she does report being very low risk for this.  She had a little bit of discharge noted but otherwise reassuring.  No cervical motion tenderness no adnexal tenderness.  Low suspicion for PID at this time.  Her BV test was positive.  Will treat with Flagyl.  Will give patient lidocaine patches, Zofran to help with symptoms of her chronic abdominal pain and have her follow-up with OB/GYN, GI.     FINAL CLINICAL IMPRESSION(S) / ED DIAGNOSES   Final diagnoses:  Bacterial vaginosis  Chronic abdominal pain     Rx / DC Orders   ED Discharge Orders          Ordered    metroNIDAZOLE (FLAGYL) 500 MG tablet  2 times daily        04/09/22 0241    ondansetron (ZOFRAN-ODT) 4 MG disintegrating tablet  Every 8 hours PRN        04/09/22 0242    lidocaine (LIDODERM) 5 %  Every 12 hours        04/09/22 0242             Note:  This document was prepared using Dragon voice recognition software and may include unintentional dictation errors.   Concha Se, MD 04/09/22 [phone]

## 2022-09-16 IMAGING — CT CT ABD-PELV W/ CM
2 of 4 series · 16 of 46 positions shown, 18 images · IV contrast (APPLIED)
Comparison: CT abdomen pelvis 02/11/2019.

CLINICAL DATA: Patient with right lower quadrant pain for 2 years.

EXAM:
CT ABDOMEN AND PELVIS WITH CONTRAST
TECHNIQUE: Multidetector CT imaging of the abdomen and pelvis was performed
using the standard protocol following bolus administration of
intravenous contrast.

[Series 2: routine abd/pel with · axial · 0.91mm/px · z∈[-896,-391]mm · 13 of 111 slices shown, 15 images]
[im 5/111  soft-tissue]
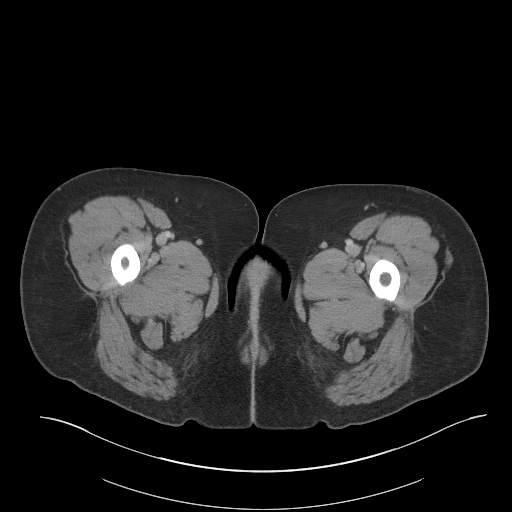
[im 5/111  bone]
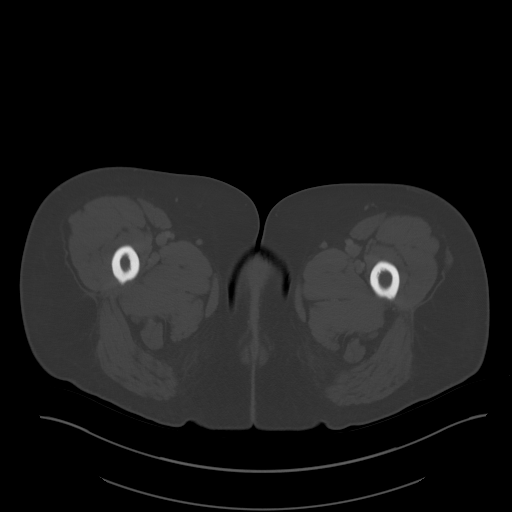
[im 15/111  soft-tissue]
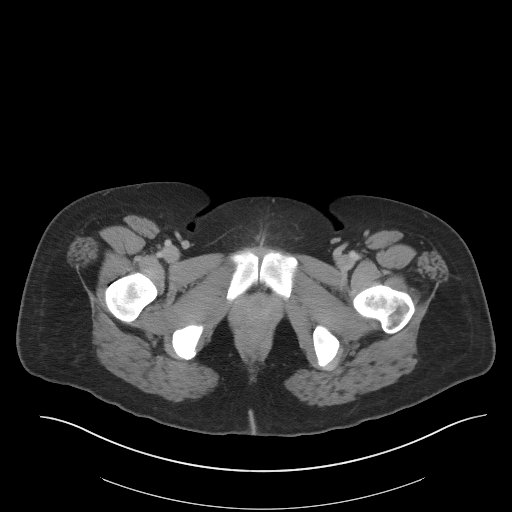
[im 24/111  soft-tissue]
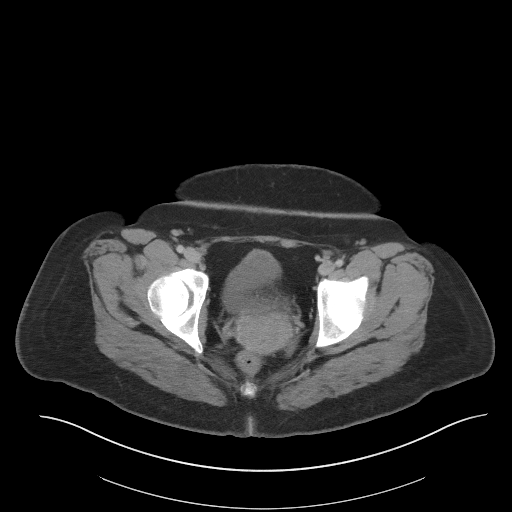
[im 29/111  soft-tissue]
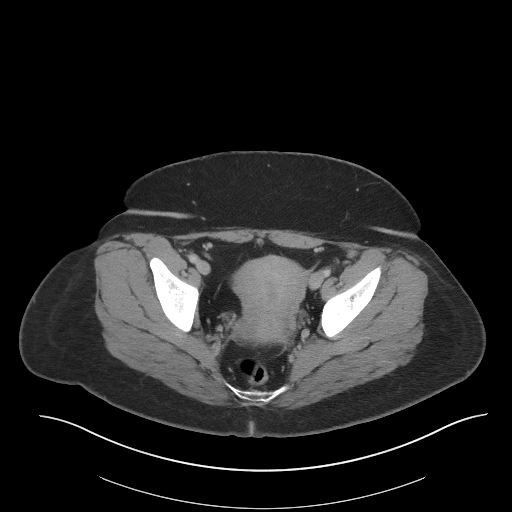
[im 39/111  soft-tissue]
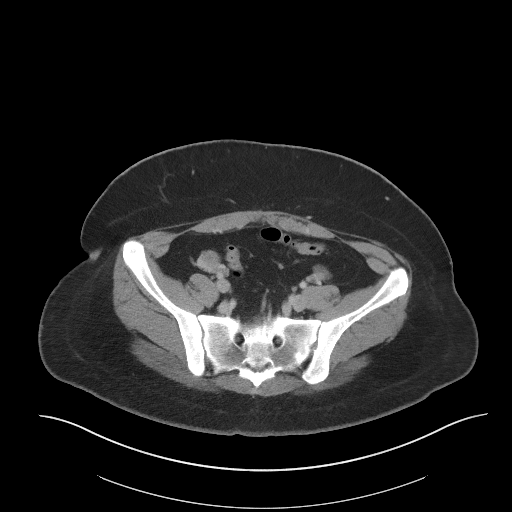
[im 48/111  soft-tissue]
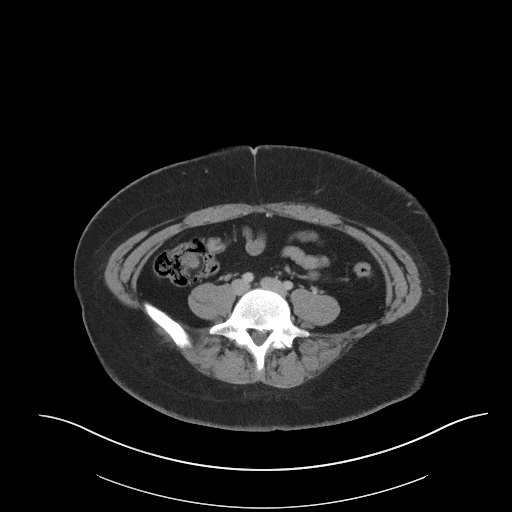
[im 58/111  soft-tissue]
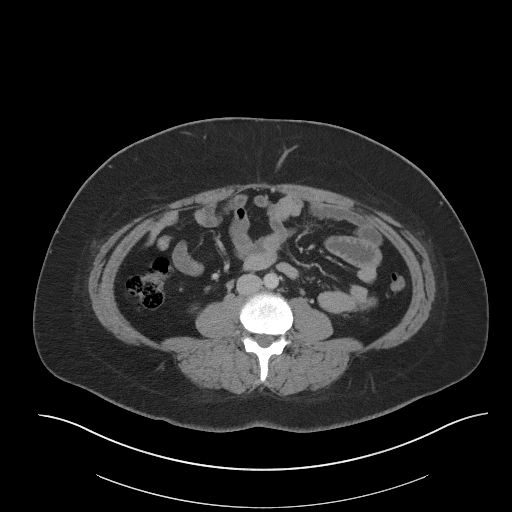
[im 63/111  soft-tissue]
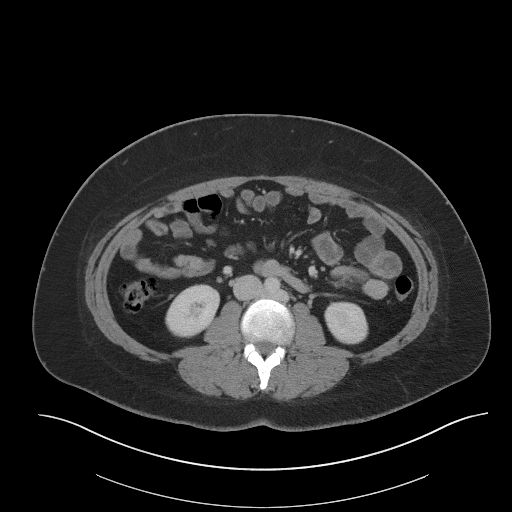
[im 72/111  soft-tissue]
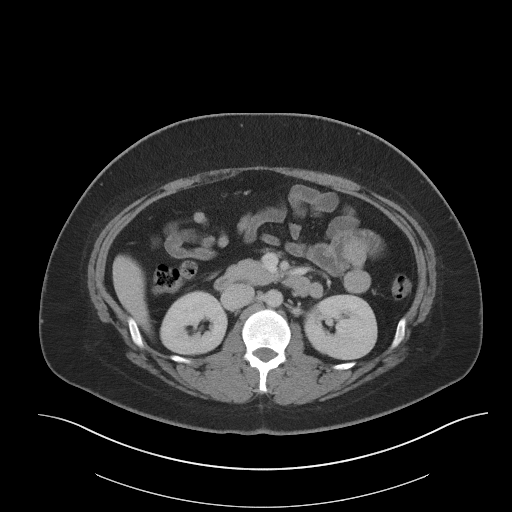
[im 72/111  bone]
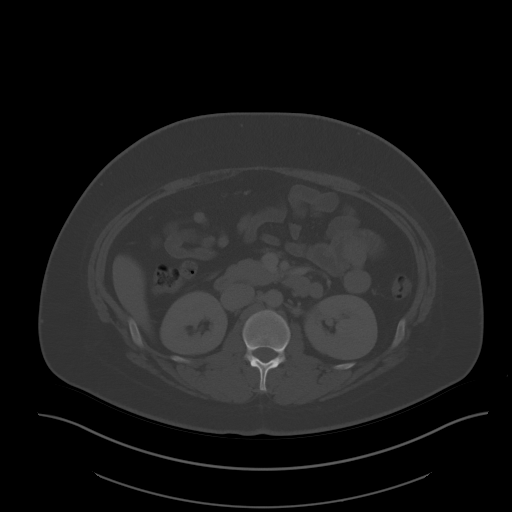
[im 82/111  soft-tissue]
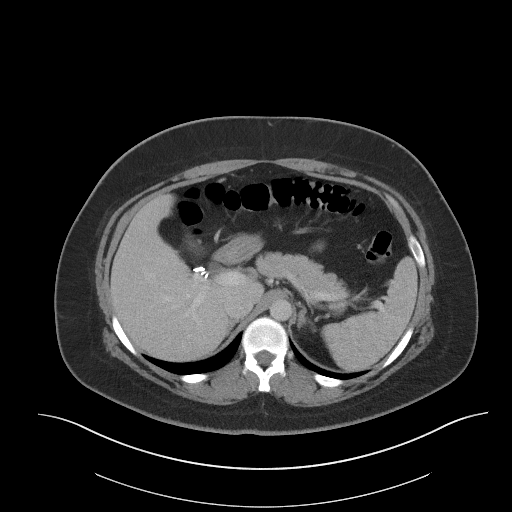
[im 87/111  soft-tissue]
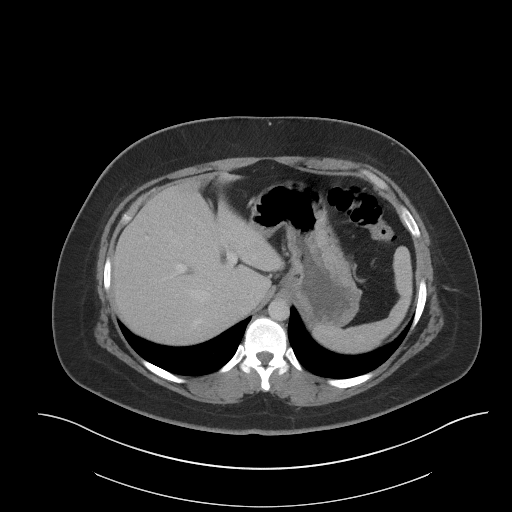
[im 96/111  soft-tissue]
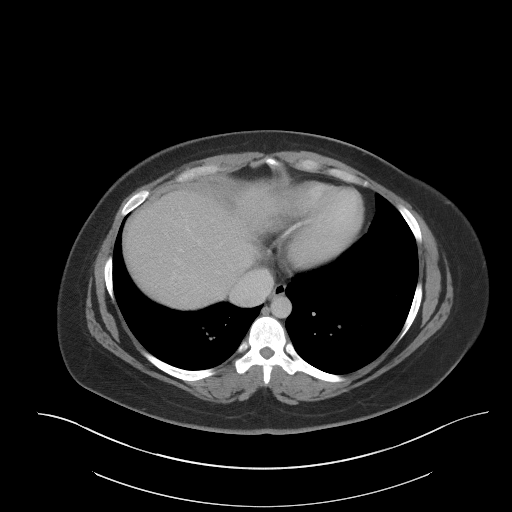
[im 106/111  soft-tissue]
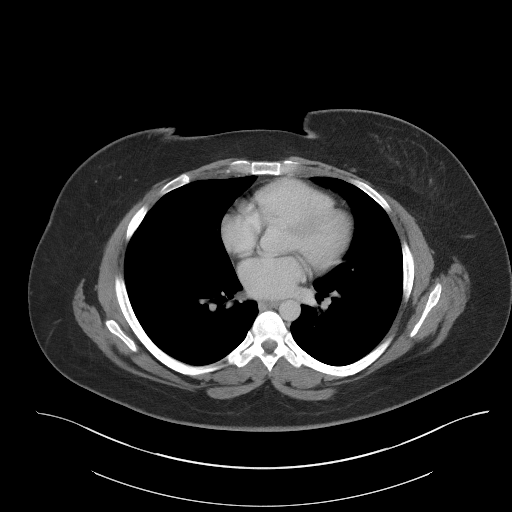

[Series 5: coronal st · coronal · 0.87mm/px · 3 of 103 slices shown]
[im 35/103  soft-tissue]
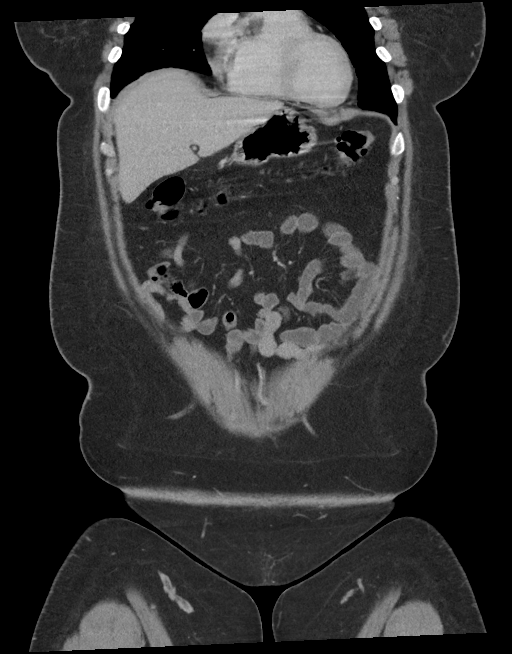
[im 46/103  soft-tissue]
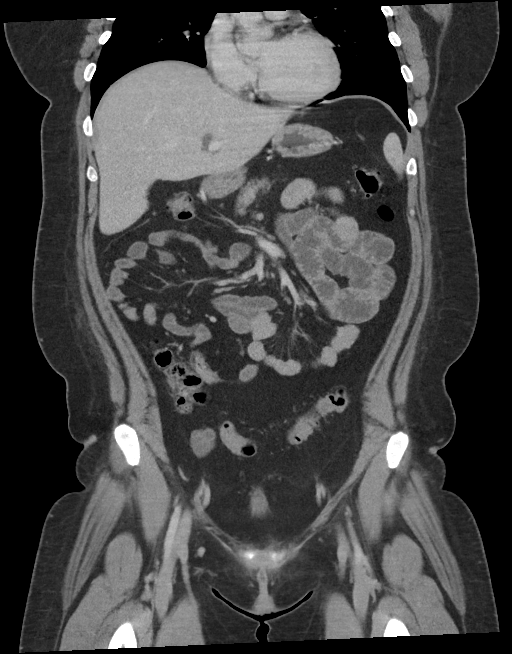
[im 57/103  soft-tissue]
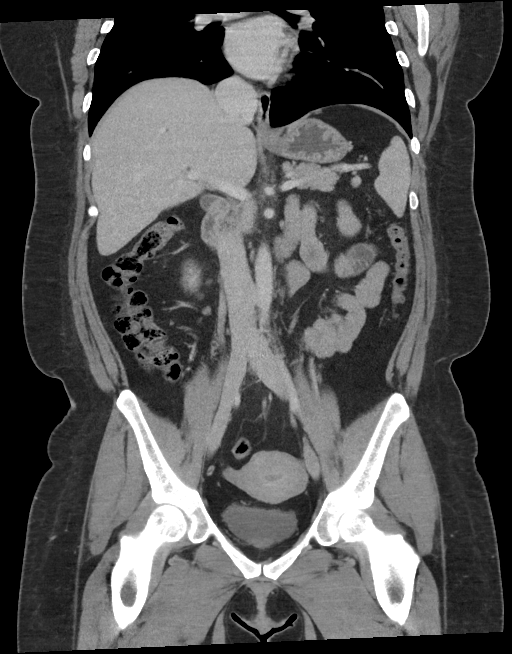

[16 of 46 positions shown; findings below may reference images not displayed]

RADIATION DOSE REDUCTION: This exam was performed according to the
departmental dose-optimization program which includes automated
exposure control, adjustment of the mA and/or kV according to
patient size and/or use of iterative reconstruction technique.

CONTRAST:  100mL OMNIPAQUE IOHEXOL 300 MG/ML  SOLN
FINDINGS: Lower chest: Normal heart size. Dependent atelectasis within the
bilateral lower lobes. No pleural effusion.

Hepatobiliary: Liver is normal in size and contour. No focal lesion
identified. Prior cholecystectomy. Mild intrahepatic biliary ductal
dilatation, likely secondary to cholecystectomy state.

Pancreas: Unremarkable

Spleen: Unremarkable

Adrenals/Urinary Tract: Normal adrenal glands. Kidneys enhance
symmetrically with contrast. No hydronephrosis. Urinary bladder is
unremarkable.

Stomach/Bowel: Normal morphology of the stomach. No evidence for
small bowel obstruction. No free fluid or free intraperitoneal air.
There is persistent narrowing of the terminal ileum with submucosal
fatty deposition. No surrounding inflammatory stranding identified.

Vascular/Lymphatic: Normal caliber abdominal aorta. No
retroperitoneal lymphadenopathy.

Reproductive: Uterus and adnexal structures are unremarkable.

Other: None.

Musculoskeletal: Right SI joint sclerosis. Probable bone island
within the left hemi sacrum.
IMPRESSION: No significant interval change in mild wall thickening and
submucosal fatty deposition of the terminal ileum. No evidence for
acute superimposed process.

## 2022-09-21 ENCOUNTER — Emergency Department: Payer: Self-pay

## 2022-09-21 ENCOUNTER — Encounter: Payer: Self-pay | Admitting: Emergency Medicine

## 2022-09-21 ENCOUNTER — Other Ambulatory Visit: Payer: Self-pay

## 2022-09-21 ENCOUNTER — Emergency Department
Admission: EM | Admit: 2022-09-21 | Discharge: 2022-09-21 | Disposition: A | Discharge status: Home / Self Care | Payer: Self-pay | Attending: Emergency Medicine | Admitting: Emergency Medicine

## 2022-09-21 DIAGNOSIS — R1031 Right lower quadrant pain: Secondary | ICD-10-CM

## 2022-09-21 DIAGNOSIS — R109 Unspecified abdominal pain: Secondary | ICD-10-CM

## 2022-09-21 DIAGNOSIS — R102 Pelvic and perineal pain: Secondary | ICD-10-CM

## 2022-09-21 DIAGNOSIS — E1165 Type 2 diabetes mellitus with hyperglycemia: Secondary | ICD-10-CM | POA: Insufficient documentation

## 2022-09-21 DIAGNOSIS — D259 Leiomyoma of uterus, unspecified: Secondary | ICD-10-CM

## 2022-09-21 LAB — LIPASE, BLOOD: Lipase: 31 U/L (ref 11–51)

## 2022-09-21 LAB — URINALYSIS, ROUTINE W REFLEX MICROSCOPIC
Bilirubin Urine: NEGATIVE
Glucose, UA: NEGATIVE mg/dL
Hgb urine dipstick: NEGATIVE
Ketones, ur: NEGATIVE mg/dL
Leukocytes,Ua: NEGATIVE
Nitrite: NEGATIVE
Protein, ur: NEGATIVE mg/dL
Specific Gravity, Urine: 1.028 (ref 1.005–1.030)
pH: 5 (ref 5.0–8.0)

## 2022-09-21 LAB — COMPREHENSIVE METABOLIC PANEL
ALT: 16 U/L (ref 0–44)
AST: 25 U/L (ref 15–41)
Albumin: 4.1 g/dL (ref 3.5–5.0)
Alkaline Phosphatase: 70 U/L (ref 38–126)
Anion gap: 7 (ref 5–15)
BUN: 10 mg/dL (ref 6–20)
CO2: 20 mmol/L — ABNORMAL LOW (ref 22–32)
Calcium: 8.5 mg/dL — ABNORMAL LOW (ref 8.9–10.3)
Chloride: 108 mmol/L (ref 98–111)
Creatinine, Ser: 0.49 mg/dL (ref 0.44–1.00)
GFR, Estimated: >60 mL/min (ref >60)
Glucose, Bld: 98 mg/dL (ref 70–99)
Potassium: 4.1 mmol/L (ref 3.5–5.1)
Sodium: 135 mmol/L (ref 135–145)
Total Bilirubin: 0.9 mg/dL (ref 0.3–1.2)
Total Protein: 7.6 g/dL (ref 6.5–8.1)

## 2022-09-21 LAB — CBC WITH DIFFERENTIAL/PLATELET
Abs Immature Granulocytes: 0.02 10*3/uL (ref 0.00–0.07)
Basophils Absolute: 0.1 10*3/uL (ref 0.0–0.1)
Basophils Relative: 1 %
Eosinophils Absolute: 0.2 10*3/uL (ref 0.0–0.5)
Eosinophils Relative: 3 %
HCT: 38.9 % (ref 36.0–46.0)
Hemoglobin: 12.6 g/dL (ref 12.0–15.0)
Immature Granulocytes: 0 %
Lymphocytes Relative: 26 %
Lymphs Abs: 1.8 10*3/uL (ref 0.7–4.0)
MCH: 27.5 pg (ref 26.0–34.0)
MCHC: 32.4 g/dL (ref 30.0–36.0)
MCV: 84.7 fL (ref 80.0–100.0)
Monocytes Absolute: 0.5 10*3/uL (ref 0.1–1.0)
Monocytes Relative: 7 %
Neutro Abs: 4.4 10*3/uL (ref 1.7–7.7)
Neutrophils Relative %: 63 %
Platelets: 346 10*3/uL (ref 150–400)
RBC: 4.59 MIL/uL (ref 3.87–5.11)
RDW: 14.1 % (ref 11.5–15.5)
WBC: 7 10*3/uL (ref 4.0–10.5)
nRBC: 0 % (ref 0.0–0.2)

## 2022-09-21 LAB — HCG, QUANTITATIVE, PREGNANCY: hCG, Beta Chain, Quant, S: <1 m[IU]/mL (ref <5)

## 2022-09-21 MED ORDER — ONDANSETRON HCL 4 MG/2ML IJ SOLN
4.0000 mg | Freq: Once | INTRAMUSCULAR | Status: AC
  Administered 2022-09-21: 4 mg via INTRAVENOUS
  Filled 2022-09-21: qty 2

## 2022-09-21 MED ORDER — KETOROLAC TROMETHAMINE 30 MG/ML IJ SOLN
15.0000 mg | Freq: Once | INTRAMUSCULAR | Status: AC
  Administered 2022-09-21: 15 mg via INTRAVENOUS
  Filled 2022-09-21: qty 1

## 2022-09-21 MED ORDER — HYDROMORPHONE HCL 1 MG/ML IJ SOLN
0.5000 mg | Freq: Once | INTRAMUSCULAR | Status: AC
  Administered 2022-09-21: 0.5 mg via INTRAVENOUS
  Filled 2022-09-21: qty 0.5

## 2022-09-21 MED ORDER — SODIUM CHLORIDE 0.9 % IV BOLUS
1000.0000 mL | Freq: Once | INTRAVENOUS | Status: AC
  Administered 2022-09-21: 1000 mL via INTRAVENOUS

## 2022-09-21 NOTE — ED Triage Notes (Signed)
Patient ambulatory to triage with steady gait, without difficulty or distress noted; pt reports x 3 days having rt lower abd/flank pain

## 2022-09-21 NOTE — Discharge Instructions (Addendum)
Your urine test does not show any signs of infection, and your ultrasound does not show any ovarian cysts or problems with blood flow.  You do have uterine fibroids, which can cause abdominal pain.  Take ibuprofen 600mg  every 6 hours as needed for pain and follow up with your Gynecologist.

## 2022-09-21 NOTE — ED Provider Notes (Signed)
[facility] Provider Note    Event Date/Time   First MD Initiated Contact with Patient 09/21/22 0502     (approximate)   History   Abdominal Pain   HPI  History obtained via telemetry Spanish interpreter  Carmen Francis is a 37 y.o. female who presents to the ED from home with a chief complaint of right flank/lower abdominal pain x 3 days.  Associated with occasional nausea and vomiting.  Denies fever/chills, chest pain, shortness of breath, hematuria or dysuria.  History of endometriosis and states it feels like "my ovaries are bursting".     Past Medical History   Past Medical History:  Diagnosis Date   Abdominal pain    Right lower   Anxiety    Diabetes mellitus without complication (HCC)    Hyperglycemia      Active Problem List  There are no problems to display for this patient.    Past Surgical History   Past Surgical History:  Procedure Laterality Date   BREAST SURGERY     CESAREAN SECTION     CHOLECYSTECTOMY     LAPAROSCOPY N/A 11/15/2018   Procedure: LAPAROSCOPY operative, left salpingectomy;  Surgeon: Christeen Douglas, MD;  Location: [facility] ORS;  Service: Gynecology;  Laterality: N/A;   LYSIS OF ADHESION  11/15/2018   Procedure: LYSIS OF ADHESION;  Surgeon: Christeen Douglas, MD;  Location: [facility] ORS;  Service: Gynecology;;   TONSILLECTOMY       Home Medications   Prior to Admission medications   Medication Sig Start Date End Date Taking? Authorizing Provider  albuterol (VENTOLIN HFA) 108 (90 Base) MCG/ACT inhaler Inhale 2 puffs into the lungs every 6 (six) hours as needed for shortness of breath. 06/24/20   Menshew, Charlesetta Ivory, PA-C  docusate sodium (COLACE) 100 MG capsule Take 1 capsule (100 mg total) by mouth 2 (two) times daily. To keep stools soft 11/15/18   Christeen Douglas, MD  gabapentin (NEURONTIN) 800 MG tablet Take 1 tablet (800 mg total) by mouth at bedtime for 14 days. Take nightly for 3 days, then up to 14  days as needed 11/15/18 11/29/18  Christeen Douglas, MD     Allergies  Patient has no known allergies.   Family History  History reviewed. No pertinent family history.   Physical Exam  Triage Vital Signs: ED Triage Vitals  Enc Vitals Group     BP 09/21/22 0449 (!) 105/39     Pulse Rate 09/21/22 0449 72     Resp 09/21/22 0449 16     Temp 09/21/22 0449 98.2 F (36.8 C)     Temp Source 09/21/22 0449 Oral     SpO2 09/21/22 0449 100 %     Weight 09/21/22 0445 200 lb 12.8 oz (91.1 kg)     Height 09/21/22 0445 5\' 2"  (1.575 m)     Head Circumference --      Peak Flow --      Pain Score 09/21/22 0445 8     Pain Loc --      Pain Edu? --      Excl. in [place]? --     Updated Vital Signs: BP (!) 95/57   Pulse (!) 50   Temp 98.2 F (36.8 C) (Oral)   Resp 20   Ht 5\' 2"  (1.575 m)   Wt 90.7 kg   LMP 08/21/2022 (Approximate)   SpO2 97%   BMI 36.58 kg/m    General: Awake, mild to moderate distress.  CV:  RRR.  Good peripheral perfusion.  Resp:  Normal effort.  CTAB. Abd:  Mild right CVAT.  Mild right lower quadrant/adnexal region tender to palpation without rebound or guarding.  No distention.  Other:  No truncal vesicles.   ED Results / Procedures / Treatments  Labs (all labs ordered are listed, but only abnormal results are displayed) Labs Reviewed  COMPREHENSIVE METABOLIC PANEL - Abnormal; Notable for the following components:      Result Value   CO2 20 (*)    Calcium 8.5 (*)    All other components within normal limits  CBC WITH DIFFERENTIAL/PLATELET  LIPASE, BLOOD  HCG, QUANTITATIVE, PREGNANCY  URINALYSIS, ROUTINE W REFLEX MICROSCOPIC     EKG  None   RADIOLOGY I have independently visualized interpreted patient CT as well as noted the radiology interpretation:  CT renal stone study: Negative  Official radiology report(s): CT Renal Stone Study  Result Date: 09/21/2022 CLINICAL DATA:  Right lower abdominal and flank pain. EXAM: CT ABDOMEN AND PELVIS WITHOUT  CONTRAST TECHNIQUE: Multidetector CT imaging of the abdomen and pelvis was performed following the standard protocol without IV contrast. RADIATION DOSE REDUCTION: This exam was performed according to the departmental dose-optimization program which includes automated exposure control, adjustment of the mA and/or kV according to patient size and/or use of iterative reconstruction technique. COMPARISON:  04/08/2022 FINDINGS: Lower chest: Unremarkable. Hepatobiliary: No suspicious focal abnormality in the liver on this study without intravenous contrast. Gallbladder is surgically absent. No intrahepatic or extrahepatic biliary dilation. Pancreas: No focal mass lesion. No dilatation of the main duct. No intraparenchymal cyst. No peripancreatic edema. Spleen: No splenomegaly. No suspicious focal mass lesion. Adrenals/Urinary Tract: No adrenal nodule or mass. Kidneys unremarkable. No evidence for hydroureter. The urinary bladder appears normal for the degree of distention. Stomach/Bowel: Stomach is unremarkable. No gastric wall thickening. No evidence of outlet obstruction. Duodenum is normally positioned as is the ligament of Treitz. No small bowel wall thickening. No small bowel dilatation. The terminal ileum is normal. The appendix is normal. No gross colonic mass. No colonic wall thickening. Vascular/Lymphatic: No abdominal aortic aneurysm. No abdominal aortic atherosclerotic calcification. There is no gastrohepatic or hepatoduodenal ligament lymphadenopathy. No retroperitoneal or mesenteric lymphadenopathy. No pelvic sidewall lymphadenopathy. Reproductive: Unremarkable. Other: No intraperitoneal free fluid. Musculoskeletal: No worrisome lytic or sclerotic osseous abnormality. IMPRESSION: No acute findings in the abdomen or pelvis. Specifically, no findings to explain the patient's history of right lower abdominal and flank pain. Electronically Signed   By: Kennith Center M.D.   On: 09/21/2022 06:39      PROCEDURES:  Critical Care performed: No  .1-3 Lead EKG Interpretation  Performed by: Irean Hong, MD Authorized by: Irean Hong, MD     Interpretation: normal     ECG rate:  70   ECG rate assessment: normal     Rhythm: sinus rhythm     Ectopy: none     Conduction: normal   Comments:     Placed on cardiac monitor to evaluate for arrhythmias    MEDICATIONS ORDERED IN ED: Medications  ondansetron (ZOFRAN) injection 4 mg (4 mg Intravenous Given 09/21/22 0529)  sodium chloride 0.9 % bolus 1,000 mL (0 mLs Intravenous Stopped 09/21/22 0642)  HYDROmorphone (DILAUDID) injection 0.5 mg (0.5 mg Intravenous Given 09/21/22 0529)  ketorolac (TORADOL) 30 MG/ML injection 15 mg (15 mg Intravenous Given 09/21/22 4098)     IMPRESSION / MDM / ASSESSMENT AND PLAN / ED COURSE  I reviewed the triage vital signs  and the nursing notes.                             37 year old female presenting with right flank/abdominal pain. Differential diagnosis includes, but is not limited to, ovarian cyst, ovarian torsion, acute appendicitis, diverticulitis, urinary tract infection/pyelonephritis, endometriosis, bowel obstruction, colitis, renal colic, gastroenteritis, hernia, fibroids, endometriosis, pregnancy related pain including ectopic pregnancy, etc. I personally reviewed patient's records and note an ED visit from 04/08/2022 for bacterial vaginosis.  Patient's presentation is most consistent with acute presentation with potential threat to life or bodily function.  The patient is on the cardiac monitor to evaluate for evidence of arrhythmia and/or significant heart rate changes.  Will obtain lab work, UA.  Start with CT renal colic study.  Administer IV fluids, IV Dilaudid for pain.  With IV Zofran for nausea.  Will reassess.  Clinical Course as of 09/21/22 0646  Thu Sep 21, 2022  1308 Pain returning, will administer IV Toradol.  CT negative, will proceed to pelvic ultrasound.  Lab work unremarkable.   Care will be transferred to the oncoming provider at change of shift. [JS]    Clinical Course User Index [JS] Irean Hong, MD     FINAL CLINICAL IMPRESSION(S) / ED DIAGNOSES   Final diagnoses:  Acute right flank pain  Right lower quadrant abdominal pain  Pelvic pain in female     Rx / DC Orders   ED Discharge Orders     None        Note:  This document was prepared using Dragon voice recognition software and may include unintentional dictation errors.   Irean Hong, MD 09/21/22 [phone]

## 2023-01-09 DIAGNOSIS — D241 Benign neoplasm of right breast: Secondary | ICD-10-CM | POA: Diagnosis not present

## 2023-04-15 ENCOUNTER — Other Ambulatory Visit: Payer: Self-pay

## 2023-04-15 ENCOUNTER — Emergency Department
Admission: EM | Admit: 2023-04-15 | Discharge: 2023-04-16 | Disposition: A | Discharge status: Home / Self Care | Payer: Self-pay | Attending: Emergency Medicine | Admitting: Emergency Medicine

## 2023-04-15 DIAGNOSIS — E119 Type 2 diabetes mellitus without complications: Secondary | ICD-10-CM | POA: Insufficient documentation

## 2023-04-15 DIAGNOSIS — M62830 Muscle spasm of back: Secondary | ICD-10-CM | POA: Insufficient documentation

## 2023-04-15 LAB — CBC WITH DIFFERENTIAL/PLATELET
Abs Immature Granulocytes: 0.02 10*3/uL (ref 0.00–0.07)
Basophils Absolute: 0.1 10*3/uL (ref 0.0–0.1)
Basophils Relative: 1 %
Eosinophils Absolute: 0.3 10*3/uL (ref 0.0–0.5)
Eosinophils Relative: 4 %
HCT: 36.4 % (ref 36.0–46.0)
Hemoglobin: 11.3 g/dL — ABNORMAL LOW (ref 12.0–15.0)
Immature Granulocytes: 0 %
Lymphocytes Relative: 24 %
Lymphs Abs: 2.1 10*3/uL (ref 0.7–4.0)
MCH: 25.9 pg — ABNORMAL LOW (ref 26.0–34.0)
MCHC: 31 g/dL (ref 30.0–36.0)
MCV: 83.5 fL (ref 80.0–100.0)
Monocytes Absolute: 0.6 10*3/uL (ref 0.1–1.0)
Monocytes Relative: 7 %
Neutro Abs: 5.7 10*3/uL (ref 1.7–7.7)
Neutrophils Relative %: 64 %
Platelets: 383 10*3/uL (ref 150–400)
RBC: 4.36 MIL/uL (ref 3.87–5.11)
RDW: 13.5 % (ref 11.5–15.5)
WBC: 8.8 10*3/uL (ref 4.0–10.5)
nRBC: 0 % (ref 0.0–0.2)

## 2023-04-15 LAB — URINALYSIS, ROUTINE W REFLEX MICROSCOPIC
Bilirubin Urine: NEGATIVE
Glucose, UA: NEGATIVE mg/dL
Hgb urine dipstick: NEGATIVE
Ketones, ur: NEGATIVE mg/dL
Leukocytes,Ua: NEGATIVE
Nitrite: NEGATIVE
Protein, ur: NEGATIVE mg/dL
Specific Gravity, Urine: 1.026 (ref 1.005–1.030)
pH: 7 (ref 5.0–8.0)

## 2023-04-15 LAB — COMPREHENSIVE METABOLIC PANEL
ALT: 18 U/L (ref 0–44)
AST: 25 U/L (ref 15–41)
Albumin: 4 g/dL (ref 3.5–5.0)
Alkaline Phosphatase: 74 U/L (ref 38–126)
Anion gap: 10 (ref 5–15)
BUN: 11 mg/dL (ref 6–20)
CO2: 21 mmol/L — ABNORMAL LOW (ref 22–32)
Calcium: 8.4 mg/dL — ABNORMAL LOW (ref 8.9–10.3)
Chloride: 104 mmol/L (ref 98–111)
Creatinine, Ser: 0.8 mg/dL (ref 0.44–1.00)
GFR, Estimated: >60 mL/min (ref >60)
Glucose, Bld: 149 mg/dL — ABNORMAL HIGH (ref 70–99)
Potassium: 4 mmol/L (ref 3.5–5.1)
Sodium: 135 mmol/L (ref 135–145)
Total Bilirubin: 0.4 mg/dL (ref 0.0–1.2)
Total Protein: 7.5 g/dL (ref 6.5–8.1)

## 2023-04-15 LAB — PREGNANCY, URINE: Preg Test, Ur: NEGATIVE

## 2023-04-15 MED ORDER — ONDANSETRON 4 MG PO TBDP
4.0000 mg | ORAL_TABLET | Freq: Once | ORAL | Status: AC
  Administered 2023-04-16: 4 mg via ORAL
  Filled 2023-04-15: qty 1

## 2023-04-15 MED ORDER — OXYCODONE-ACETAMINOPHEN 5-325 MG PO TABS
2.0000 | ORAL_TABLET | Freq: Once | ORAL | Status: AC
  Administered 2023-04-16: 2 via ORAL
  Filled 2023-04-15: qty 2

## 2023-04-15 MED ORDER — IBUPROFEN 800 MG PO TABS
800.0000 mg | ORAL_TABLET | Freq: Once | ORAL | Status: AC
  Administered 2023-04-16: 800 mg via ORAL
  Filled 2023-04-15: qty 1

## 2023-04-15 NOTE — ED Triage Notes (Signed)
 Pt reports right flank pain x 2 days associated with diarrhea. Denies hematuria/dysuria. She also reports she woke up yesterday with numbness and tingling to left side of her face that lasted about 10 minutes. No numbness or tingling now.

## 2023-04-15 NOTE — ED Provider Notes (Signed)
 [facility] Provider Note    Event Date/Time   First MD Initiated Contact with Patient 04/15/23 2332     (approximate)   History   Flank Pain   HPI  Carmen Francis is a 38 y.o. female with history of diabetes, obesity who presents to the emergency department with complaints of right-sided lower back pain worse with movement, palpation.  Denies any injury.  No numbness, tingling, weakness, bowel or bladder incontinence, fever.  No history of epidural injection or back surgery.  No nausea, vomiting, dysuria or hematuria.   History provided by patient, significant other.    Past Medical History:  Diagnosis Date   Abdominal pain    Right lower   Anxiety    Diabetes mellitus without complication (HCC)    Hyperglycemia     Past Surgical History:  Procedure Laterality Date   BREAST SURGERY     CESAREAN SECTION     CHOLECYSTECTOMY     LAPAROSCOPY N/A 11/15/2018   Procedure: LAPAROSCOPY operative, left salpingectomy;  Surgeon: Verdon Keen, MD;  Location: [facility] ORS;  Service: Gynecology;  Laterality: N/A;   LYSIS OF ADHESION  11/15/2018   Procedure: LYSIS OF ADHESION;  Surgeon: Verdon Keen, MD;  Location: [facility] OR[facility];  Service: Gynecology;;   TONSILLECTOMY      MEDICATIONS:  Prior to Admission medications   Medication Sig Start Date End Date Taking? Authorizing Provider  albuterol  (VENTOLIN  HFA) 108 (90 Base) MCG/ACT inhaler Inhale 2 puffs into the lungs every 6 (six) hours as needed for shortness of breath. 06/24/20   Menshew, Candida LULLA Kings, PA-C  docusate sodium  (COLACE) 100 MG capsule Take 1 capsule (100 mg total) by mouth 2 (two) times daily. To keep stools soft 11/15/18   Verdon Keen, MD  gabapentin  (NEURONTIN ) 800 MG tablet Take 1 tablet (800 mg total) by mouth at bedtime for 14 days. Take nightly for 3 days, then up to 14 days as needed 11/15/18 11/29/18  Verdon Keen, MD    Physical Exam   Triage Vital Signs: ED Triage Vitals   Encounter Vitals Group     BP 04/15/23 2301 125/86     Systolic BP Percentile --      Diastolic BP Percentile --      Pulse Rate 04/15/23 2301 91     Resp 04/15/23 2301 18     Temp 04/15/23 2301 98.5 F (36.9 C)     Temp Source 04/15/23 2301 Oral     SpO2 04/15/23 2301 99 %     Weight 04/15/23 2301 200 lb (90.7 kg)     Height 04/15/23 2301 5' 2 (1.575 m)     Head Circumference --      Peak Flow --      Pain Score 04/15/23 2300 8     Pain Loc --      Pain Education --      Exclude from Growth Chart --     Most recent vital signs: Vitals:   04/15/23 2301  BP: 125/86  Pulse: 91  Resp: 18  Temp: 98.5 F (36.9 C)  SpO2: 99%    CONSTITUTIONAL: Alert, responds appropriately to questions. Well-appearing; well-nourished HEAD: Normocephalic, atraumatic EYES: Conjunctivae clear, pupils appear equal, sclera nonicteric ENT: normal nose; moist mucous membranes NECK: Supple, normal ROM CARD: RRR; S1 and S2 appreciated RESP: Normal chest excursion without splinting or tachypnea; breath sounds clear and equal bilaterally; no wheezes, no rhonchi, no rales, no hypoxia or respiratory distress, speaking full  sentences ABD/GI: Non-distended; soft, non-tender, no rebound, no guarding, no peritoneal signs BACK: The back appears normal, tender over the right lumbar paraspinal muscles which reproduces her pain.  No overlying skin changes.  No midline spinal tenderness, step-off or deformity. EXT: Normal ROM in all joints; no deformity noted, no edema SKIN: Normal color for age and race; warm; no rash on exposed skin NEURO: Moves all extremities equally, normal speech, normal sensation diffusely, no saddle anesthesia, no hyperreflexia, normal gait PSYCH: The patient's mood and manner are appropriate.   ED Results / Procedures / Treatments   LABS: (all labs ordered are listed, but only abnormal results are displayed) Labs Reviewed  URINALYSIS, ROUTINE W REFLEX MICROSCOPIC - Abnormal;  Notable for the following components:      Result Value   Color, Urine YELLOW (*)    APPearance HAZY (*)    All other components within normal limits  CBC WITH DIFFERENTIAL/PLATELET - Abnormal; Notable for the following components:   Hemoglobin 11.3 (*)    MCH 25.9 (*)    All other components within normal limits  COMPREHENSIVE METABOLIC PANEL - Abnormal; Notable for the following components:   CO2 21 (*)    Glucose, Bld 149 (*)    Calcium 8.4 (*)    All other components within normal limits  PREGNANCY, URINE     EKG:  EKG Interpretation Date/Time:    Ventricular Rate:    PR Interval:    QRS Duration:    QT Interval:    QTC Calculation:   R Axis:      Text Interpretation:           RADIOLOGY: My personal review and interpretation of imaging:    I have personally reviewed all radiology reports.   No results found.   PROCEDURES:  Critical Care performed: No     Procedures    IMPRESSION / MDM / ASSESSMENT AND PLAN / ED COURSE  I reviewed the triage vital signs and the nursing notes.    Patient here with complaints of right lower back pain.  No neurodeficits.  No injury.     DIFFERENTIAL DIAGNOSIS (includes but not limited to):   Muscle strain, muscle spasm, doubt cauda equina, epidural abscess or hematoma, discitis or osteomyelitis, fracture.  Low suspicion for UTI, pyelonephritis or kidney stone.   Patient's presentation is most consistent with acute complicated illness / injury requiring diagnostic workup.   PLAN: Labs obtained from triage show no leukocytosis, normal creatinine and electrolytes.  Urine shows no blood or sign of infection.  Pregnancy test negative.  I do not think this is a kidney stone.  I do not feel she needs abdominal imaging.  No red flag symptoms to suggest neurosurgical emergency.  No injury or midline spinal tenderness to suggest fracture of the spine.  Will give pain medication and reassess.   MEDICATIONS GIVEN IN  ED: Medications  oxyCODONE -acetaminophen  (PERCOCET/ROXICET) 5-325 MG per tablet 2 tablet (2 tablets Oral Given 04/16/23 0002)  ibuprofen  (ADVIL ) tablet 800 mg (800 mg Oral Given 04/16/23 0002)  ondansetron  (ZOFRAN -ODT) disintegrating tablet 4 mg (4 mg Oral Given 04/16/23 0003)     ED COURSE: Patient reports feeling better.  I feel she is safe to be discharged.  Will discharge with pain medication.  Will provide with work note.  Discussed supportive care instructions and return precautions.   At this time, I do not feel there is any life-threatening condition present. I reviewed all nursing notes, vitals, pertinent previous records.  All lab and urine results, EKGs, imaging ordered have been independently reviewed and interpreted by myself.  I reviewed all available radiology reports from any imaging ordered this visit.  Based on my assessment, I feel the patient is safe to be discharged home without further emergent workup and can continue workup as an outpatient as needed. Discussed all findings, treatment plan as well as usual and customary return precautions.  They verbalize understanding and are comfortable with this plan.  Outpatient follow-up has been provided as needed.  All questions have been answered.    CONSULTS:  none   OUTSIDE RECORDS REVIEWED: Reviewed last OB/GYN note on 10/25/2022.       FINAL CLINICAL IMPRESSION(S) / ED DIAGNOSES   Final diagnoses:  Lumbar paraspinal muscle spasm     Rx / DC Orders   ED Discharge Orders          Ordered    oxyCODONE -acetaminophen  (PERCOCET) 5-325 MG tablet  Every 8 hours PRN        04/16/23 0035    ibuprofen  (ADVIL ) 800 MG tablet  Every 8 hours PRN        04/16/23 0035    lidocaine  (LIDODERM ) 5 %  Every 12 hours        04/16/23 0035    ondansetron  (ZOFRAN -ODT) 4 MG disintegrating tablet  Every 6 hours PRN        04/16/23 0036             Note:  This document was prepared using Dragon voice recognition software and may  include unintentional dictation errors.   Laymond Postle, Josette SAILOR, DO 04/16/23 [phone]

## 2023-04-16 MED ORDER — LIDOCAINE 5 % EX PTCH: 1.0000 | MEDICATED_PATCH | Freq: Two times a day (BID) | CUTANEOUS | 0 refills | Status: AC

## 2023-04-16 MED ORDER — OXYCODONE-ACETAMINOPHEN 5-325 MG PO TABS: 2.0000 | ORAL_TABLET | Freq: Three times a day (TID) | ORAL | 0 refills | Status: AC | PRN

## 2023-04-16 MED ORDER — ONDANSETRON 4 MG PO TBDP: 4.0000 mg | ORAL_TABLET | Freq: Four times a day (QID) | ORAL | 0 refills | Status: AC | PRN

## 2023-04-16 MED ORDER — IBUPROFEN 800 MG PO TABS: 800.0000 mg | ORAL_TABLET | Freq: Three times a day (TID) | ORAL | 0 refills | Status: AC | PRN

## 2023-04-16 NOTE — Discharge Instructions (Addendum)
 You are being provided a prescription for opiates (also known as narcotics) for pain control.  Opiates can be addictive and should only be used when absolutely necessary for pain control when other alternatives do not work.  We recommend you only use them for the recommended amount of time and only as prescribed.  Please do not take with other sedative medications or alcohol.  Please do not drive, operate machinery, make important decisions while taking opiates.  Please note that these medications can be addictive and have high abuse potential.  Patients can become addicted to narcotics after only taking them for a few days.  Please keep these medications locked away from children, teenagers or any family members with history of substance abuse.  Narcotic pain medicine may also make you constipated.  You may use over-the-counter medications such as MiraLAX, Colace to prevent constipation.  If you become constipated, you may use over-the-counter enemas as needed.  Itching and nausea are also common side effects of narcotic pain medication.  If you develop uncontrolled vomiting or a rash, please stop these medications and seek medical care.  You may alternate heat and ice to your back.  Gentle stretching, massage can also help with discomfort.  Please do not lift anything heavy over 10 pounds for the next several days.  Please follow-up with your PCP if symptoms not improving.  Please return to the emergency department if you begin having numbness in your legs, unable to hold your bowel or bladder, unable to walk due to weakness, fever.   Le estn recetando opiceos (tambin conocidos como narcticos) para human resources officer.  Los opiceos pueden ser group 1 automotive y slo deben usarse cuando sea absolutamente necesario para controlar el dolor cuando otras alternativas no funcionan.  Le recomendamos que los utilice nicamente durante el tiempo recomendado y segn lo prescrito.  No lo tome con otros medicamentos  sedantes o alcohol.  Por favor, no conduzca, opere maquinaria ni tome decisiones importantes mientras toma opiceos.  Tenga en cuenta que estos medicamentos pueden ser adictivos y tener un alto potencial de abuso.  Los pacientes pueden volverse adictos a los narcticos despus de tomarlos slo [employer].  Mantenga estos medicamentos bajo llave y fuera del alcance de nios, adolescentes o miembros de la familia con antecedentes de abuso de sustancias.  Los analgsicos narcticos tambin pension scheme manager.  Puede utilizar medicamentos de venta libre como MiraLAX, Colace para prevenir el estreimiento.  Si sufre estreimiento, puede utilizar enemas de venta libre segn sea necesario.  La picazn y las nuseas tambin son efectos secundarios comunes de los analgsicos narcticos.  Si desarrolla vmitos incontrolados o sarpullido, suspenda estos medicamentos y busque atencin mdica.  Puedes alternar calor y hielo en tu espalda.  Los estiramientos suaves y los masajes tambin pueden ayudar con las molestias.  No levante nada que pese ms de 10 libras durante los [address].  Haga un seguimiento con su PCP si los sntomas no mejoran.  Regrese al departamento de emergencias si comienza a cytogeneticist las piernas, no puede contener el intestino o la vejiga, no puede caminar debido a debilidad o fiebre.

## 2023-05-31 ENCOUNTER — Other Ambulatory Visit: Payer: Self-pay

## 2023-05-31 ENCOUNTER — Emergency Department
Admission: EM | Admit: 2023-05-31 | Discharge: 2023-05-31 | Disposition: A | Discharge status: Home / Self Care | Payer: Self-pay | Attending: Emergency Medicine | Admitting: Emergency Medicine

## 2023-05-31 ENCOUNTER — Emergency Department: Payer: Self-pay

## 2023-05-31 ENCOUNTER — Encounter: Payer: Self-pay | Admitting: Emergency Medicine

## 2023-05-31 DIAGNOSIS — R519 Headache, unspecified: Secondary | ICD-10-CM | POA: Insufficient documentation

## 2023-05-31 DIAGNOSIS — R0602 Shortness of breath: Secondary | ICD-10-CM | POA: Insufficient documentation

## 2023-05-31 DIAGNOSIS — E871 Hypo-osmolality and hyponatremia: Secondary | ICD-10-CM | POA: Insufficient documentation

## 2023-05-31 DIAGNOSIS — R0789 Other chest pain: Secondary | ICD-10-CM | POA: Insufficient documentation

## 2023-05-31 LAB — CBC
HCT: 35.6 % — ABNORMAL LOW (ref 36.0–46.0)
Hemoglobin: 11.1 g/dL — ABNORMAL LOW (ref 12.0–15.0)
MCH: 25.2 pg — ABNORMAL LOW (ref 26.0–34.0)
MCHC: 31.2 g/dL (ref 30.0–36.0)
MCV: 80.9 fL (ref 80.0–100.0)
Platelets: 413 10*3/uL — ABNORMAL HIGH (ref 150–400)
RBC: 4.4 MIL/uL (ref 3.87–5.11)
RDW: 13.5 % (ref 11.5–15.5)
WBC: 8.6 10*3/uL (ref 4.0–10.5)
nRBC: 0 % (ref 0.0–0.2)

## 2023-05-31 LAB — BASIC METABOLIC PANEL
Anion gap: 9 (ref 5–15)
BUN: 13 mg/dL (ref 6–20)
CO2: 23 mmol/L (ref 22–32)
Calcium: 8.9 mg/dL (ref 8.9–10.3)
Chloride: 99 mmol/L (ref 98–111)
Creatinine, Ser: 0.6 mg/dL (ref 0.44–1.00)
GFR, Estimated: >60 mL/min (ref >60)
Glucose, Bld: 138 mg/dL — ABNORMAL HIGH (ref 70–99)
Potassium: 4 mmol/L (ref 3.5–5.1)
Sodium: 131 mmol/L — ABNORMAL LOW (ref 135–145)

## 2023-05-31 LAB — HEPATIC FUNCTION PANEL
ALT: 20 U/L (ref 0–44)
AST: 26 U/L (ref 15–41)
Albumin: 4.1 g/dL (ref 3.5–5.0)
Alkaline Phosphatase: 77 U/L (ref 38–126)
Bilirubin, Direct: <0.1 mg/dL (ref 0.0–0.2)
Total Bilirubin: 0.4 mg/dL (ref 0.0–1.2)
Total Protein: 7.9 g/dL (ref 6.5–8.1)

## 2023-05-31 LAB — POC URINE PREG, ED: Preg Test, Ur: NEGATIVE

## 2023-05-31 LAB — TROPONIN I (HIGH SENSITIVITY)
Troponin I (High Sensitivity): 3 ng/L (ref <18)
Troponin I (High Sensitivity): <2 ng/L (ref <18)

## 2023-05-31 LAB — D-DIMER, QUANTITATIVE: D-Dimer, Quant: <0.27 ug{FEU}/mL (ref 0.00–0.50)

## 2023-05-31 LAB — LIPASE, BLOOD: Lipase: 29 U/L (ref 11–51)

## 2023-05-31 MED ORDER — ACETAMINOPHEN 500 MG PO TABS
1000.0000 mg | ORAL_TABLET | Freq: Once | ORAL | Status: AC
  Administered 2023-05-31: 1000 mg via ORAL
  Filled 2023-05-31: qty 2

## 2023-05-31 MED ORDER — KETOROLAC TROMETHAMINE 15 MG/ML IJ SOLN
15.0000 mg | Freq: Once | INTRAMUSCULAR | Status: AC
  Administered 2023-05-31: 15 mg via INTRAVENOUS
  Filled 2023-05-31: qty 1

## 2023-05-31 MED ORDER — ONDANSETRON HCL 4 MG/2ML IJ SOLN
4.0000 mg | Freq: Once | INTRAMUSCULAR | Status: AC
  Administered 2023-05-31: 4 mg via INTRAVENOUS
  Filled 2023-05-31: qty 2

## 2023-05-31 MED ORDER — SODIUM CHLORIDE 0.9 % IV BOLUS
1000.0000 mL | Freq: Once | INTRAVENOUS | Status: AC
  Administered 2023-05-31: 1000 mL via INTRAVENOUS

## 2023-05-31 NOTE — ED Provider Notes (Signed)
 [facility] Provider Note    Event Date/Time   First MD Initiated Contact with Patient 05/31/23 1422     (approximate)   History   Chest Pain   HPI  Carmen Francis is a 38 y.o. female who is otherwise healthy, history of anxiety who comes in with concerns for chest pain.  Patient reports intermittent chest pain for 1 month.  She states is associated with a little bit of shortness of breath and pain with taking a deep breath but denies any other risk factors for PE.  She does report that around 11:00 today that she did take a 300 mg edible and the symptoms started shortly after that.  Has not taken an edible in the past 6 months.  And if had the episodes previously without association of the edible.  She denies any other urinary symptoms.  She does report a headache as well.  Denies any vision changes.   Physical Exam   Triage Vital Signs: ED Triage Vitals  Encounter Vitals Group     BP 05/31/23 1320 129/84     Systolic BP Percentile --      Diastolic BP Percentile --      Pulse Rate 05/31/23 1320 (!) 107     Resp 05/31/23 1320 20     Temp 05/31/23 1320 98.3 F (36.8 C)     Temp Source 05/31/23 1320 Oral     SpO2 05/31/23 1320 100 %     Weight 05/31/23 1316 215 lb (97.5 kg)     Height 05/31/23 1316 5' (1.524 m)     Head Circumference --      Peak Flow --      Pain Score 05/31/23 1316 6     Pain Loc --      Pain Education --      Exclude from Growth Chart --     Most recent vital signs: Vitals:   05/31/23 1320  BP: 129/84  Pulse: (!) 107  Resp: 20  Temp: 98.3 F (36.8 C)  SpO2: 100%     General: Awake, no distress.  CV:  Good peripheral perfusion.  Resp:  Normal effort.  Clear lungs Abd:  No distention.  Soft and nontender Other:  Patient is holding her arm up over her eyes stating that she has a headache.  She is able to remove her arm and has cranial nerves that are intact, equal strength in arms and legs. Patient is moving her  left leg and states that she has a history of anxiety  ED Results / Procedures / Treatments   Labs (all labs ordered are listed, but only abnormal results are displayed) Labs Reviewed  BASIC METABOLIC PANEL - Abnormal; Notable for the following components:      Result Value   Sodium 131 (*)    Glucose, Bld 138 (*)    All other components within normal limits  CBC - Abnormal; Notable for the following components:   Hemoglobin 11.1 (*)    HCT 35.6 (*)    MCH 25.2 (*)    Platelets 413 (*)    All other components within normal limits  POC URINE PREG, ED  TROPONIN I (HIGH SENSITIVITY)     EKG  My interpretation of EKG:  Sinus tachycardia rate of 108 without any ST elevation or T wave inversions, normal intervals  RADIOLOGY I have reviewed the xray personally and interpreted no evidence of any pneumonia   PROCEDURES:  Critical Care performed:  No  Procedures   MEDICATIONS ORDERED IN ED: Medications  sodium chloride 0.9 % bolus 1,000 mL (has no administration in time range)  ondansetron (ZOFRAN) injection 4 mg (has no administration in time range)  acetaminophen (TYLENOL) tablet 1,000 mg (has no administration in time range)  ketorolac (TORADOL) 15 MG/ML injection 15 mg (has no administration in time range)     IMPRESSION / MDM / ASSESSMENT AND PLAN / ED COURSE  I reviewed the triage vital signs and the nursing notes.   Patient's presentation is most consistent with acute presentation with potential threat to life or bodily function.   Patient comes in with intermittent episodes of chest pain, sharp pleuritic shortness of breath.  Unable to [facility] out given tachycardic although I do suspect some of that is related to some dehydration, marijuana use and will get D-dimer to rule out PE, cardiac markers to evaluate for ACS.  Will treat patient symptomatically given her migraine.  Do not see any evidence of cranial nerve deficit to suggest other acute pathology.  I suspect  this is also related to the marijuana we discussed discontinuation of marijuana.  However given the marijuana was only today and she reports intermittent chest pain, pleuritic shortness of breath we will have her follow-up outpatient with cardiology and will rule her out for other acute pathology here  Troponin was negative.  BMP shows low sodium of 131.  CBC shows stable hemoglobin  Patient be handed off to oncoming team pending blood work, reassessment after medications  I recommended getting a Nurse, learning disability but patient declined wanting a family member to translate      FINAL CLINICAL IMPRESSION(S) / ED DIAGNOSES   Final diagnoses:  Atypical chest pain     Rx / DC Orders   ED Discharge Orders     None        Note:  This document was prepared using Dragon voice recognition software and may include unintentional dictation errors.   Concha Se, MD 05/31/23 [phone]

## 2023-05-31 NOTE — ED Notes (Signed)
 See triage note   Presents with chest pain  States she developed chest pain started about 1 month ago  Pain was intermittent   But states pain became worse after eating a 300 mg edible   Also has had some SOB  Denies any fever or cough

## 2023-05-31 NOTE — ED Triage Notes (Signed)
 Pt via POV from home. Pt L sided CP and SOB today. States she ate an 300mg  edible around 1100 today. Denies any cough. Denies cardiac hx. Pt is A&OX4 and NAD

## 2023-05-31 NOTE — ED Provider Notes (Signed)
-----------------------------------------   4:37 PM on 05/31/2023 -----------------------------------------  D-dimer, repeat troponin, and x-ray are all negative.  I independently viewed and interpreted the images there is no evidence of focal consolidation or edema.  On reassessment, the patient is comfortable appearing.  She is stable for discharge at this time.  I counseled her and her family member on the results of the workup.  I gave strict return precautions and they expressed understanding.   Dionne Bucy, MD 05/31/23 [phone]

## 2023-11-14 ENCOUNTER — Emergency Department: Payer: MEDICAID

## 2023-11-14 ENCOUNTER — Emergency Department
Admission: EM | Admit: 2023-11-14 | Discharge: 2023-11-14 | Disposition: A | Discharge status: Home / Self Care | Payer: MEDICAID | Attending: Emergency Medicine | Admitting: Emergency Medicine

## 2023-11-14 ENCOUNTER — Other Ambulatory Visit: Payer: Self-pay

## 2023-11-14 DIAGNOSIS — M545 Low back pain, unspecified: Secondary | ICD-10-CM | POA: Diagnosis present

## 2023-11-14 DIAGNOSIS — N83201 Unspecified ovarian cyst, right side: Secondary | ICD-10-CM | POA: Insufficient documentation

## 2023-11-14 LAB — URINALYSIS, ROUTINE W REFLEX MICROSCOPIC
Bilirubin Urine: NEGATIVE
Glucose, UA: NEGATIVE mg/dL
Hgb urine dipstick: NEGATIVE
Ketones, ur: NEGATIVE mg/dL
Leukocytes,Ua: NEGATIVE
Nitrite: NEGATIVE
Protein, ur: NEGATIVE mg/dL
Specific Gravity, Urine: 1.013 (ref 1.005–1.030)
pH: 5 (ref 5.0–8.0)

## 2023-11-14 LAB — POC URINE PREG, ED: Preg Test, Ur: NEGATIVE

## 2023-11-14 LAB — CBC WITH DIFFERENTIAL/PLATELET
Abs Immature Granulocytes: 0.03 K/uL (ref 0.00–0.07)
Basophils Absolute: 0.1 K/uL (ref 0.0–0.1)
Basophils Relative: 1 %
Eosinophils Absolute: 0.3 K/uL (ref 0.0–0.5)
Eosinophils Relative: 5 %
HCT: 33.9 % — ABNORMAL LOW (ref 36.0–46.0)
Hemoglobin: 10.4 g/dL — ABNORMAL LOW (ref 12.0–15.0)
Immature Granulocytes: 1 %
Lymphocytes Relative: 26 %
Lymphs Abs: 1.6 K/uL (ref 0.7–4.0)
MCH: 23.8 pg — ABNORMAL LOW (ref 26.0–34.0)
MCHC: 30.7 g/dL (ref 30.0–36.0)
MCV: 77.6 fL — ABNORMAL LOW (ref 80.0–100.0)
Monocytes Absolute: 0.5 K/uL (ref 0.1–1.0)
Monocytes Relative: 8 %
Neutro Abs: 3.5 K/uL (ref 1.7–7.7)
Neutrophils Relative %: 59 %
Platelets: 385 K/uL (ref 150–400)
RBC: 4.37 MIL/uL (ref 3.87–5.11)
RDW: 14.9 % (ref 11.5–15.5)
WBC: 5.9 K/uL (ref 4.0–10.5)
nRBC: 0 % (ref 0.0–0.2)

## 2023-11-14 LAB — COMPREHENSIVE METABOLIC PANEL WITH GFR
ALT: 22 U/L (ref 0–44)
AST: 29 U/L (ref 15–41)
Albumin: 3.5 g/dL (ref 3.5–5.0)
Alkaline Phosphatase: 82 U/L (ref 38–126)
Anion gap: 9 (ref 5–15)
BUN: 8 mg/dL (ref 6–20)
CO2: 25 mmol/L (ref 22–32)
Calcium: 9.1 mg/dL (ref 8.9–10.3)
Chloride: 102 mmol/L (ref 98–111)
Creatinine, Ser: 0.46 mg/dL (ref 0.44–1.00)
GFR, Estimated: >60 mL/min (ref >60)
Glucose, Bld: 103 mg/dL — ABNORMAL HIGH (ref 70–99)
Potassium: 3.7 mmol/L (ref 3.5–5.1)
Sodium: 136 mmol/L (ref 135–145)
Total Bilirubin: 0.4 mg/dL (ref 0.0–1.2)
Total Protein: 7 g/dL (ref 6.5–8.1)

## 2023-11-14 LAB — LIPASE, BLOOD: Lipase: 33 U/L (ref 11–51)

## 2023-11-14 LAB — PREGNANCY, URINE: Preg Test, Ur: NEGATIVE

## 2023-11-14 MED ORDER — KETOROLAC TROMETHAMINE 15 MG/ML IJ SOLN
15.0000 mg | Freq: Once | INTRAMUSCULAR | Status: AC
  Administered 2023-11-14 (×2): 15 mg via INTRAVENOUS
  Filled 2023-11-14: qty 1

## 2023-11-14 MED ORDER — LIDOCAINE 5 % EX PTCH
1.0000 | MEDICATED_PATCH | CUTANEOUS | Status: DC
  Administered 2023-11-14 (×2): 1 via TRANSDERMAL
  Filled 2023-11-14: qty 1

## 2023-11-14 MED ORDER — IOHEXOL 300 MG/ML  SOLN
100.0000 mL | Freq: Once | INTRAMUSCULAR | Status: AC | PRN
  Administered 2023-11-14 (×2): 100 mL via INTRAVENOUS

## 2023-11-14 NOTE — ED Provider Notes (Signed)
 [facility] Provider Note    Event Date/Time   First MD Initiated Contact with Patient 11/14/23 0715     (approximate)   History   Back Pain   HPI  Carmen Francis is a 38 y.o. female with a past medical history of obesity who presents today for evaluation of right sided low back pain and right lower quadrant pain that began on Sunday after she had a trip.  Patient reports that her pain is primarily in her right lower back and her right lower quadrant.  She reports that it radiates down to her mid thigh.  She denies any weakness in her legs.  She has not had any urinary or fecal incontinence or retention or saddle anesthesia.  No nausea, vomiting, or diarrhea.  No fevers or chills.  She reports that she has had similar pain once before, several months ago for which she was seen in the emergency department.  She has not taken anything for her symptoms since they began.  There are no active problems to display for this patient.         Physical Exam   Triage Vital Signs: ED Triage Vitals  Encounter Vitals Group     BP 11/14/23 0459 110/73     Girls Systolic BP Percentile --      Girls Diastolic BP Percentile --      Boys Systolic BP Percentile --      Boys Diastolic BP Percentile --      Pulse Rate 11/14/23 0458 79     Resp 11/14/23 0458 17     Temp 11/14/23 0458 97.7 F (36.5 C)     Temp Source 11/14/23 0458 Oral     SpO2 11/14/23 0458 97 %     Weight 11/14/23 0458 234 lb (106.1 kg)     Height 11/14/23 0458 5' 2 (1.575 m)     Head Circumference --      Peak Flow --      Pain Score 11/14/23 0500 7     Pain Loc --      Pain Education --      Exclude from Growth Chart --     Most recent vital signs: Vitals:   11/14/23 0459 11/14/23 1140  BP: 110/73 108/73  Pulse:  63  Resp:  16  Temp:  98.2 F (36.8 C)  SpO2:  100%    Physical Exam Vitals and nursing note reviewed.  Constitutional:      General: Awake and alert. No acute  distress.    Appearance: Normal appearance.  HENT:     Head: Normocephalic and atraumatic.     Mouth: Mucous membranes are moist.  Eyes:     General: PERRL. Normal EOMs        Right eye: No discharge.        Left eye: No discharge.     Conjunctiva/sclera: Conjunctivae normal.  Cardiovascular:     Rate and Rhythm: Normal rate and regular rhythm.     Pulses: Normal pulses.     Heart sounds: Normal heart sounds Pulmonary:     Effort: Pulmonary effort is normal. No respiratory distress.     Breath sounds: Normal breath sounds.  Abdominal:     Abdomen is soft. There is mild right lower quadrant abdominal tenderness. No rebound or guarding. No distention. Back: No midline tenderness.  Right sided lumbar paraspinal muscle area tenderness.  Strength and sensation 5/5 to bilateral lower extremities. Normal great toe  extension against resistance. Normal sensation throughout feet. Normal patellar reflexes. Negative SLR and opposite SLR bilaterally. Negative FABER test Musculoskeletal:        General: No swelling. Normal range of motion.     Cervical back: Normal range of motion and neck supple.  Skin:    General: Skin is warm and dry.     Capillary Refill: Capillary refill takes less than 2 seconds.     Findings: No rash.  Neurological:     Mental Status: The patient is awake and alert.      ED Results / Procedures / Treatments   Labs (all labs ordered are listed, but only abnormal results are displayed) Labs Reviewed  URINALYSIS, ROUTINE W REFLEX MICROSCOPIC - Abnormal; Notable for the following components:      Result Value   Color, Urine YELLOW (*)    APPearance HAZY (*)    All other components within normal limits  COMPREHENSIVE METABOLIC PANEL WITH GFR - Abnormal; Notable for the following components:   Glucose, Bld 103 (*)    All other components within normal limits  CBC WITH DIFFERENTIAL/PLATELET - Abnormal; Notable for the following components:   Hemoglobin 10.4 (*)     HCT 33.9 (*)    MCV 77.6 (*)    MCH 23.8 (*)    All other components within normal limits  LIPASE, BLOOD  PREGNANCY, URINE  POC URINE PREG, ED     EKG     RADIOLOGY I independently reviewed and interpreted imaging and agree with radiologists findings.     PROCEDURES:  Critical Care performed:   Procedures   MEDICATIONS ORDERED IN ED: Medications  lidocaine  (LIDODERM ) 5 % 1 patch (1 patch Transdermal Patch Applied 11/14/23 0928)  ketorolac  (TORADOL ) 15 MG/ML injection 15 mg (15 mg Intravenous Given 11/14/23 0930)  iohexol  (OMNIPAQUE ) 300 MG/ML solution 100 mL (100 mLs Intravenous Contrast Given 11/14/23 0949)     IMPRESSION / MDM / ASSESSMENT AND PLAN / ED COURSE  I reviewed the triage vital signs and the nursing notes.   Differential diagnosis includes, but is not limited to, muscle strain, muscle spasm, disc herniation, appendicitis, pyelonephritis, ovarian cyst, psoas strain.  Patient is awake and alert, hemodynamically stable and afebrile.  I reviewed the patient's chart.  Patient had a visit for similar presentation in January of this year.  Because patient does have mild right lower quadrant tenderness on exam, recommended further evaluation with labs and imaging.  Patient is agreeable with this plan.  Urinalysis obtained in triage is negative for any acute findings, and blood work is overall reassuring.  Pregnancy is negative.  She was treated symptomatically with Toradol  and Lidoderm  patch with good effect.  CT scan reveals no appendicitis, though does reveal right ovarian pathology.  Ultrasound obtained for further evaluation.  Upon reevaluation, she reports that she feels significantly improved after the Toradol .  Ultrasound obtained reveals a benign appearing right ovarian cyst.  No evidence of torsion.  Patient feels markedly improved after the Toradol .  She is able to ambulate with a steady gait, unassisted.  We discussed return precautions and outpatient  follow-up.  Patient understands and agrees with plan.  She was discharged in stable condition.   Patient's presentation is most consistent with acute complicated illness / injury requiring diagnostic workup.      FINAL CLINICAL IMPRESSION(S) / ED DIAGNOSES   Final diagnoses:  Acute right-sided low back pain without sciatica  Cyst of right ovary     Rx /  DC Orders   ED Discharge Orders     None        Note:  This document was prepared using Dragon voice recognition software and may include unintentional dictation errors.   Reneisha Stilley E, PA-C 11/14/23 1444    Levander Slate, MD 11/14/23 [phone]

## 2023-11-14 NOTE — ED Triage Notes (Signed)
 Pt reports lower back pain that began a few days ago, pain has made it difficult to walk. Denies dysuria

## 2023-11-14 NOTE — ED Provider Notes (Signed)
 [facility] Provider Note    Event Date/Time   First MD Initiated Contact with Patient 11/14/23 0715     (approximate)   History   Back Pain   HPI  Carmen Francis is a 38 y.o. female with a past medical history of obesity who presents today for evaluation of right sided low back pain and right lower quadrant pain that began on Sunday after she had a trip.  Patient reports that her pain is primarily in her right lower back and her right lower quadrant.  She reports that it radiates down to her mid thigh.  She denies any weakness in her legs.  She has not had any urinary or fecal incontinence or retention or saddle anesthesia.  No nausea, vomiting, or diarrhea.  No fevers or chills.  She reports that she has had similar pain once before, several months ago for which she was seen in the emergency department.  She has not taken anything for her symptoms since they began.  There are no active problems to display for this patient.         Physical Exam   Triage Vital Signs: ED Triage Vitals  Encounter Vitals Group     BP 11/14/23 0459 110/73     Girls Systolic BP Percentile --      Girls Diastolic BP Percentile --      Boys Systolic BP Percentile --      Boys Diastolic BP Percentile --      Pulse Rate 11/14/23 0458 79     Resp 11/14/23 0458 17     Temp 11/14/23 0458 97.7 F (36.5 C)     Temp Source 11/14/23 0458 Oral     SpO2 11/14/23 0458 97 %     Weight 11/14/23 0458 234 lb (106.1 kg)     Height 11/14/23 0458 5' 2 (1.575 m)     Head Circumference --      Peak Flow --      Pain Score 11/14/23 0500 7     Pain Loc --      Pain Education --      Exclude from Growth Chart --     Most recent vital signs: Vitals:   11/14/23 0459 11/14/23 1140  BP: 110/73 108/73  Pulse:  63  Resp:  16  Temp:  98.2 F (36.8 C)  SpO2:  100%    Physical Exam Vitals and nursing note reviewed.  Constitutional:      General: Awake and alert. No acute  distress.    Appearance: Normal appearance.  HENT:     Head: Normocephalic and atraumatic.     Mouth: Mucous membranes are moist.  Eyes:     General: PERRL. Normal EOMs        Right eye: No discharge.        Left eye: No discharge.     Conjunctiva/sclera: Conjunctivae normal.  Cardiovascular:     Rate and Rhythm: Normal rate and regular rhythm.     Pulses: Normal pulses.     Heart sounds: Normal heart sounds Pulmonary:     Effort: Pulmonary effort is normal. No respiratory distress.     Breath sounds: Normal breath sounds.  Abdominal:     Abdomen is soft. There is mild right lower quadrant abdominal tenderness. No rebound or guarding. No distention. Back: No midline tenderness.  Right sided lumbar paraspinal muscle area tenderness.  Strength and sensation 5/5 to bilateral lower extremities. Normal great toe  extension against resistance. Normal sensation throughout feet. Normal patellar reflexes. Negative SLR and opposite SLR bilaterally. Negative FABER test Musculoskeletal:        General: No swelling. Normal range of motion.     Cervical back: Normal range of motion and neck supple.  Skin:    General: Skin is warm and dry.     Capillary Refill: Capillary refill takes less than 2 seconds.     Findings: No rash.  Neurological:     Mental Status: The patient is awake and alert.      ED Results / Procedures / Treatments   Labs (all labs ordered are listed, but only abnormal results are displayed) Labs Reviewed  URINALYSIS, ROUTINE W REFLEX MICROSCOPIC - Abnormal; Notable for the following components:      Result Value   Color, Urine YELLOW (*)    APPearance HAZY (*)    All other components within normal limits  COMPREHENSIVE METABOLIC PANEL WITH GFR - Abnormal; Notable for the following components:   Glucose, Bld 103 (*)    All other components within normal limits  CBC WITH DIFFERENTIAL/PLATELET - Abnormal; Notable for the following components:   Hemoglobin 10.4 (*)     HCT 33.9 (*)    MCV 77.6 (*)    MCH 23.8 (*)    All other components within normal limits  LIPASE, BLOOD  PREGNANCY, URINE  POC URINE PREG, ED     EKG     RADIOLOGY I independently reviewed and interpreted imaging and agree with radiologists findings.     PROCEDURES:  Critical Care performed:   Procedures   MEDICATIONS ORDERED IN ED: Medications  lidocaine  (LIDODERM ) 5 % 1 patch (1 patch Transdermal Patch Applied 11/14/23 0928)  ketorolac  (TORADOL ) 15 MG/ML injection 15 mg (15 mg Intravenous Given 11/14/23 0930)  iohexol  (OMNIPAQUE ) 300 MG/ML solution 100 mL (100 mLs Intravenous Contrast Given 11/14/23 0949)     IMPRESSION / MDM / ASSESSMENT AND PLAN / ED COURSE  I reviewed the triage vital signs and the nursing notes.   Differential diagnosis includes, but is not limited to, muscle strain, muscle spasm, disc herniation, appendicitis, pyelonephritis, ovarian cyst, psoas strain.  Patient is awake and alert, hemodynamically stable and afebrile.  I reviewed the patient's chart.  Patient had a visit for similar presentation in January of this year.  Because patient does have mild right lower quadrant tenderness on exam, recommended further evaluation with labs and imaging.  Patient is agreeable with this plan.  Urinalysis obtained in triage is negative for any acute findings, and blood work is overall reassuring.  Pregnancy is negative.  She was treated symptomatically with Toradol  and Lidoderm  patch with good effect.  CT scan reveals no appendicitis, though does reveal right ovarian pathology.  Ultrasound obtained for further evaluation.  Upon reevaluation, she reports that she feels significantly improved after the Toradol .  Ultrasound obtained reveals a benign appearing right ovarian cyst.  No evidence of torsion.  Patient feels markedly improved after the Toradol .  She is able to ambulate with a steady gait, unassisted.  We discussed return precautions and outpatient  follow-up.  Patient understands and agrees with plan.  She was discharged in stable condition.   Patient's presentation is most consistent with acute complicated illness / injury requiring diagnostic workup.      FINAL CLINICAL IMPRESSION(S) / ED DIAGNOSES   Final diagnoses:  Acute right-sided low back pain without sciatica  Cyst of right ovary     Rx /  DC Orders   ED Discharge Orders     None        Note:  This document was prepared using Dragon voice recognition software and may include unintentional dictation errors.   Paiden Cavell E, PA-C 11/14/23 1444    Levander Slate, MD 11/14/23 [phone]

## 2023-11-14 NOTE — Discharge Instructions (Signed)
 Your blood work and urinalysis and imaging were normal with exception of an ovarian cyst.  Please follow-up with your outpatient provider.  Please return for any new, worsening, or change in symptoms or other concerns.  It was a pleasure caring for you today.

## 2024-01-22 ENCOUNTER — Other Ambulatory Visit: Payer: Self-pay

## 2024-01-22 ENCOUNTER — Emergency Department
Admission: EM | Admit: 2024-01-22 | Discharge: 2024-01-22 | Disposition: A | Discharge status: Home / Self Care | Payer: MEDICAID | Attending: Emergency Medicine | Admitting: Emergency Medicine

## 2024-01-22 DIAGNOSIS — R519 Headache, unspecified: Secondary | ICD-10-CM | POA: Insufficient documentation

## 2024-01-22 DIAGNOSIS — H9201 Otalgia, right ear: Secondary | ICD-10-CM | POA: Insufficient documentation

## 2024-01-22 DIAGNOSIS — R110 Nausea: Secondary | ICD-10-CM | POA: Insufficient documentation

## 2024-01-22 DIAGNOSIS — E119 Type 2 diabetes mellitus without complications: Secondary | ICD-10-CM | POA: Insufficient documentation

## 2024-01-22 LAB — URINALYSIS, ROUTINE W REFLEX MICROSCOPIC
Bacteria, UA: NONE SEEN
Bilirubin Urine: NEGATIVE
Glucose, UA: NEGATIVE mg/dL
Ketones, ur: NEGATIVE mg/dL
Leukocytes,Ua: NEGATIVE
Nitrite: NEGATIVE
Protein, ur: NEGATIVE mg/dL
Specific Gravity, Urine: 1.023 (ref 1.005–1.030)
pH: 5 (ref 5.0–8.0)

## 2024-01-22 LAB — COMPREHENSIVE METABOLIC PANEL WITH GFR
ALT: 28 U/L (ref 0–44)
AST: 38 U/L (ref 15–41)
Albumin: 3.8 g/dL (ref 3.5–5.0)
Alkaline Phosphatase: 110 U/L (ref 38–126)
Anion gap: 10 (ref 5–15)
BUN: 7 mg/dL (ref 6–20)
CO2: 25 mmol/L (ref 22–32)
Calcium: 8.9 mg/dL (ref 8.9–10.3)
Chloride: 100 mmol/L (ref 98–111)
Creatinine, Ser: 0.57 mg/dL (ref 0.44–1.00)
GFR, Estimated: >60 mL/min (ref >60)
Glucose, Bld: 148 mg/dL — ABNORMAL HIGH (ref 70–99)
Potassium: 3.9 mmol/L (ref 3.5–5.1)
Sodium: 135 mmol/L (ref 135–145)
Total Bilirubin: 0.4 mg/dL (ref 0.0–1.2)
Total Protein: 7.5 g/dL (ref 6.5–8.1)

## 2024-01-22 LAB — CBC WITH DIFFERENTIAL/PLATELET
Abs Immature Granulocytes: 0.06 K/uL (ref 0.00–0.07)
Basophils Absolute: 0.1 K/uL (ref 0.0–0.1)
Basophils Relative: 1 %
Eosinophils Absolute: 0.3 K/uL (ref 0.0–0.5)
Eosinophils Relative: 3 %
HCT: 33.9 % — ABNORMAL LOW (ref 36.0–46.0)
Hemoglobin: 10.7 g/dL — ABNORMAL LOW (ref 12.0–15.0)
Immature Granulocytes: 1 %
Lymphocytes Relative: 27 %
Lymphs Abs: 2.4 K/uL (ref 0.7–4.0)
MCH: 23.9 pg — ABNORMAL LOW (ref 26.0–34.0)
MCHC: 31.6 g/dL (ref 30.0–36.0)
MCV: 75.7 fL — ABNORMAL LOW (ref 80.0–100.0)
Monocytes Absolute: 0.6 K/uL (ref 0.1–1.0)
Monocytes Relative: 7 %
Neutro Abs: 5.4 K/uL (ref 1.7–7.7)
Neutrophils Relative %: 61 %
Platelets: 445 K/uL — ABNORMAL HIGH (ref 150–400)
RBC: 4.48 MIL/uL (ref 3.87–5.11)
RDW: 15.6 % — ABNORMAL HIGH (ref 11.5–15.5)
WBC: 8.8 K/uL (ref 4.0–10.5)
nRBC: 0 % (ref 0.0–0.2)

## 2024-01-22 LAB — LIPASE, BLOOD: Lipase: 35 U/L (ref 11–51)

## 2024-01-22 LAB — POC URINE PREG, ED: Preg Test, Ur: NEGATIVE

## 2024-01-22 MED ORDER — KETOROLAC TROMETHAMINE 30 MG/ML IJ SOLN
30.0000 mg | Freq: Once | INTRAMUSCULAR | Status: AC
  Administered 2024-01-22: 30 mg via INTRAVENOUS
  Filled 2024-01-22: qty 1

## 2024-01-22 MED ORDER — ONDANSETRON HCL 4 MG/2ML IJ SOLN
4.0000 mg | Freq: Once | INTRAMUSCULAR | Status: AC
  Administered 2024-01-22: 4 mg via INTRAVENOUS
  Filled 2024-01-22: qty 2

## 2024-01-22 MED ORDER — BUTALBITAL-APAP-CAFFEINE 50-325-40 MG PO TABS: 1.0000 | ORAL_TABLET | Freq: Four times a day (QID) | ORAL | 0 refills | Status: AC | PRN

## 2024-01-22 MED ORDER — DEXAMETHASONE SOD PHOSPHATE PF 10 MG/ML IJ SOLN
10.0000 mg | Freq: Once | INTRAMUSCULAR | Status: AC
  Administered 2024-01-22: 10 mg via INTRAVENOUS

## 2024-01-22 MED ORDER — SODIUM CHLORIDE 0.9 % IV BOLUS
1000.0000 mL | Freq: Once | INTRAVENOUS | Status: AC
  Administered 2024-01-22: 1000 mL via INTRAVENOUS

## 2024-01-22 MED ORDER — DIPHENHYDRAMINE HCL 50 MG/ML IJ SOLN
12.5000 mg | Freq: Once | INTRAMUSCULAR | Status: AC
  Administered 2024-01-22: 12.5 mg via INTRAVENOUS
  Filled 2024-01-22: qty 1

## 2024-01-22 NOTE — ED Triage Notes (Signed)
 Pt reports headache and n/v that began earlier today and a right ear ache. Denies cough or congestion.

## 2024-01-22 NOTE — ED Provider Notes (Signed)
 [facility] Emergency Department Provider Note     Event Date/Time   First MD Initiated Contact with Patient 01/22/24 2148     (approximate)   History   Headache   HPI  Carmen Francis is a 38 y.o. female with a past medical history of diabetes presents to the ED for acute onset of a headache with associated nausea and right ear pain.  Associated symptoms includes light sensitivity.  Denies trauma, vision changes, hearing loss or dizziness.  Denies fever or recent illnesses.     Physical Exam   Triage Vital Signs: ED Triage Vitals  Encounter Vitals Group     BP 01/22/24 1908 127/85     Girls Systolic BP Percentile --      Girls Diastolic BP Percentile --      Boys Systolic BP Percentile --      Boys Diastolic BP Percentile --      Pulse Rate 01/22/24 1908 95     Resp 01/22/24 1908 20     Temp 01/22/24 1908 97.6 F (36.4 C)     Temp Source 01/22/24 1908 Oral     SpO2 01/22/24 1908 100 %     Weight 01/22/24 1910 230 lb (104.3 kg)     Height 01/22/24 1910 5' 2 (1.575 m)     Head Circumference --      Peak Flow --      Pain Score 01/22/24 1910 10     Pain Loc --      Pain Education --      Exclude from Growth Chart --     Most recent vital signs: Vitals:   01/22/24 1908 01/22/24 2255  BP: 127/85 (!) 114/91  Pulse: 95 65  Resp: 20 18  Temp: 97.6 F (36.4 C)   SpO2: 100% 100%    General: Alert and oriented. INAD.    Head:  NCAT.  Tenderness to palpation over right temporal region and anterior ear. Eyes:  PERRLA. EOMI.  Ears:  EACs patent. Tympanic membranes clear bilaterally. No bulging, erythema or discharge.  Neck:   No cervical spine tenderness to palpation.  CV:  Good peripheral perfusion. RRR. RESP:  Normal effort. LCTAB. NEURO: Cranial nerves II-XII intact. No focal deficits. Speech is clear. Sensation and motor function intact. Normal muscle strength of UE & LE.   ED Results / Procedures / Treatments   Labs (all labs  ordered are listed, but only abnormal results are displayed) Labs Reviewed  CBC WITH DIFFERENTIAL/PLATELET - Abnormal; Notable for the following components:      Result Value   Hemoglobin 10.7 (*)    HCT 33.9 (*)    MCV 75.7 (*)    MCH 23.9 (*)    RDW 15.6 (*)    Platelets 445 (*)    All other components within normal limits  COMPREHENSIVE METABOLIC PANEL WITH GFR - Abnormal; Notable for the following components:   Glucose, Bld 148 (*)    All other components within normal limits  URINALYSIS, ROUTINE W REFLEX MICROSCOPIC - Abnormal; Notable for the following components:   Color, Urine YELLOW (*)    APPearance HAZY (*)    Hgb urine dipstick MODERATE (*)    All other components within normal limits  LIPASE, BLOOD  POC URINE PREG, ED    No results found.  PROCEDURES:  Critical Care performed: No  Procedures  MEDICATIONS ORDERED IN ED: Medications  sodium chloride  0.9 % bolus 1,000 mL (0 mLs  Intravenous Stopped 01/22/24 2355)  ketorolac  (TORADOL ) 30 MG/ML injection 30 mg (30 mg Intravenous Given 01/22/24 2246)  ondansetron  (ZOFRAN ) injection 4 mg (4 mg Intravenous Given 01/22/24 2246)  dexamethasone  (DECADRON ) injection 10 mg (10 mg Intravenous Given 01/22/24 2245)  diphenhydrAMINE (BENADRYL) injection 12.5 mg (12.5 mg Intravenous Given 01/22/24 2246)   IMPRESSION / MDM / ASSESSMENT AND PLAN / ED COURSE  I reviewed the triage vital signs and the nursing notes.                              Clinical Course as of 01/23/24 2341  Tue Jan 22, 2024  2333 On reassessment patient is reporting feeling better following migraine cocktail.  [MH]    Clinical Course User Index [MH] Carmen Francis A, PA-C    38 y.o. female presents to the emergency department for evaluation and treatment of acute headache. See HPI for further details.   Differential diagnosis includes, but is not limited to trigeminal neuralgia, meningitis/encephalitis,  tension headache, temporal arteritis,  migraine or migraine equivalent, non-specific headache.   Patient's presentation is most consistent with acute complicated illness / injury requiring diagnostic workup.  Patient is alert and oriented.  She is hemodynamic stable and afebrile.  Physical exam findings are stated above.  Normal neuroexam.  Lab work is reassuring.  Urinalysis reveals moderate Hgb.  No indication for imaging at this time.  Low suspicion for meningitis as there is no meningeal signs.  Migraine cocktail administered.  On reassessment patient reports feeling better.  Resolved tenderness to her temporal region.  She is in stable condition for discharge home and outpatient management.  Advised her to follow-up with her primary care provider in 3 days for further management if symptoms persist.  ED return precautions cussed.  FINAL CLINICAL IMPRESSION(S) / ED DIAGNOSES   Final diagnoses:  Bad headache    Rx / DC Orders   ED Discharge Orders          Ordered    butalbital-acetaminophen -caffeine (FIORICET) 50-325-40 MG tablet  Every 6 hours PRN        01/22/24 2335             Note:  This document was prepared using Dragon voice recognition software and may include unintentional dictation errors.    Carmen, Carmen Francis A, PA-C 01/23/24 2341    Francis, Carmen K, MD 02/01/24 [phone]

## 2024-01-22 NOTE — Discharge Instructions (Addendum)
 You were evaluated in the ED for headache.  Your lab work is reassuring.  Please follow-up with your primary care provider in 3 days if symptoms persist.  Get plenty of rest and stay hydrated.
